# Patient Record
Sex: Female | Born: 1985 | Race: White | Hispanic: No | Smoking: Never smoker
Health system: Southern US, Community
[De-identification: ages and names within clinical notes are randomized; demographics above are authoritative.]

## PROBLEM LIST (undated history)

## (undated) ENCOUNTER — Inpatient Hospital Stay: Payer: Self-pay

## (undated) DIAGNOSIS — F419 Anxiety disorder, unspecified: Secondary | ICD-10-CM

## (undated) DIAGNOSIS — F329 Major depressive disorder, single episode, unspecified: Secondary | ICD-10-CM

## (undated) DIAGNOSIS — F32A Depression, unspecified: Secondary | ICD-10-CM

---
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
## 2005-09-12 ENCOUNTER — Emergency Department: Payer: Self-pay | Admitting: Unknown Physician Specialty

## 2005-09-13 ENCOUNTER — Ambulatory Visit: Payer: Self-pay | Admitting: Unknown Physician Specialty

## 2006-05-07 ENCOUNTER — Inpatient Hospital Stay: Payer: Self-pay | Admitting: Obstetrics & Gynecology

## 2007-09-13 ENCOUNTER — Ambulatory Visit: Payer: Self-pay | Admitting: Family Medicine

## 2010-05-28 ENCOUNTER — Ambulatory Visit: Payer: Self-pay | Admitting: Internal Medicine

## 2010-08-26 ENCOUNTER — Inpatient Hospital Stay: Payer: Self-pay | Admitting: Obstetrics and Gynecology

## 2014-10-14 HISTORY — PX: WISDOM TOOTH EXTRACTION: SHX21

## 2016-12-01 LAB — OB RESULTS CONSOLE GC/CHLAMYDIA: GC PROBE AMP, GENITAL: NEGATIVE

## 2017-01-05 DIAGNOSIS — H66002 Acute suppurative otitis media without spontaneous rupture of ear drum, left ear: Secondary | ICD-10-CM | POA: Diagnosis not present

## 2017-01-05 DIAGNOSIS — J069 Acute upper respiratory infection, unspecified: Secondary | ICD-10-CM | POA: Diagnosis not present

## 2017-01-25 DIAGNOSIS — J029 Acute pharyngitis, unspecified: Secondary | ICD-10-CM | POA: Diagnosis not present

## 2017-01-25 DIAGNOSIS — J020 Streptococcal pharyngitis: Secondary | ICD-10-CM | POA: Diagnosis not present

## 2017-01-25 DIAGNOSIS — H65192 Other acute nonsuppurative otitis media, left ear: Secondary | ICD-10-CM | POA: Diagnosis not present

## 2017-01-28 DIAGNOSIS — H5203 Hypermetropia, bilateral: Secondary | ICD-10-CM | POA: Diagnosis not present

## 2017-02-13 DIAGNOSIS — F988 Other specified behavioral and emotional disorders with onset usually occurring in childhood and adolescence: Secondary | ICD-10-CM | POA: Diagnosis not present

## 2017-02-13 DIAGNOSIS — F329 Major depressive disorder, single episode, unspecified: Secondary | ICD-10-CM | POA: Diagnosis not present

## 2017-05-27 DIAGNOSIS — F988 Other specified behavioral and emotional disorders with onset usually occurring in childhood and adolescence: Secondary | ICD-10-CM | POA: Diagnosis not present

## 2017-05-27 DIAGNOSIS — F329 Major depressive disorder, single episode, unspecified: Secondary | ICD-10-CM | POA: Diagnosis not present

## 2017-05-27 DIAGNOSIS — R238 Other skin changes: Secondary | ICD-10-CM | POA: Diagnosis not present

## 2017-05-31 LAB — OB RESULTS CONSOLE GC/CHLAMYDIA: CHLAMYDIA, DNA PROBE: NEGATIVE

## 2017-06-24 DIAGNOSIS — Z113 Encounter for screening for infections with a predominantly sexual mode of transmission: Secondary | ICD-10-CM | POA: Diagnosis not present

## 2017-06-24 DIAGNOSIS — Z124 Encounter for screening for malignant neoplasm of cervix: Secondary | ICD-10-CM | POA: Diagnosis not present

## 2017-06-24 DIAGNOSIS — R87613 High grade squamous intraepithelial lesion on cytologic smear of cervix (HGSIL): Secondary | ICD-10-CM | POA: Diagnosis not present

## 2017-06-24 DIAGNOSIS — Z1151 Encounter for screening for human papillomavirus (HPV): Secondary | ICD-10-CM | POA: Diagnosis not present

## 2017-06-24 DIAGNOSIS — Z01419 Encounter for gynecological examination (general) (routine) without abnormal findings: Secondary | ICD-10-CM | POA: Diagnosis not present

## 2017-07-08 DIAGNOSIS — R87613 High grade squamous intraepithelial lesion on cytologic smear of cervix (HGSIL): Secondary | ICD-10-CM | POA: Diagnosis not present

## 2017-07-08 DIAGNOSIS — R87810 Cervical high risk human papillomavirus (HPV) DNA test positive: Secondary | ICD-10-CM | POA: Diagnosis not present

## 2017-07-08 DIAGNOSIS — B977 Papillomavirus as the cause of diseases classified elsewhere: Secondary | ICD-10-CM | POA: Diagnosis not present

## 2017-07-08 DIAGNOSIS — N871 Moderate cervical dysplasia: Secondary | ICD-10-CM | POA: Diagnosis not present

## 2017-07-31 DIAGNOSIS — N871 Moderate cervical dysplasia: Secondary | ICD-10-CM | POA: Diagnosis not present

## 2017-07-31 DIAGNOSIS — Z3200 Encounter for pregnancy test, result unknown: Secondary | ICD-10-CM | POA: Diagnosis not present

## 2017-07-31 DIAGNOSIS — N72 Inflammatory disease of cervix uteri: Secondary | ICD-10-CM | POA: Diagnosis not present

## 2017-07-31 DIAGNOSIS — B977 Papillomavirus as the cause of diseases classified elsewhere: Secondary | ICD-10-CM | POA: Diagnosis not present

## 2017-09-24 DIAGNOSIS — O3680X1 Pregnancy with inconclusive fetal viability, fetus 1: Secondary | ICD-10-CM | POA: Diagnosis not present

## 2017-09-24 DIAGNOSIS — Z9889 Other specified postprocedural states: Secondary | ICD-10-CM | POA: Diagnosis not present

## 2017-09-24 DIAGNOSIS — Z3201 Encounter for pregnancy test, result positive: Secondary | ICD-10-CM | POA: Diagnosis not present

## 2017-09-24 DIAGNOSIS — O3440 Maternal care for other abnormalities of cervix, unspecified trimester: Secondary | ICD-10-CM | POA: Diagnosis not present

## 2017-09-24 DIAGNOSIS — Z3200 Encounter for pregnancy test, result unknown: Secondary | ICD-10-CM | POA: Diagnosis not present

## 2017-10-08 DIAGNOSIS — Z3201 Encounter for pregnancy test, result positive: Secondary | ICD-10-CM | POA: Diagnosis not present

## 2017-10-08 DIAGNOSIS — G43909 Migraine, unspecified, not intractable, without status migrainosus: Secondary | ICD-10-CM | POA: Diagnosis not present

## 2017-10-08 DIAGNOSIS — Z9889 Other specified postprocedural states: Secondary | ICD-10-CM | POA: Diagnosis not present

## 2017-10-14 LAB — OB RESULTS CONSOLE RUBELLA ANTIBODY, IGM: Rubella: NON-IMMUNE/NOT IMMUNE

## 2017-10-23 DIAGNOSIS — O3440 Maternal care for other abnormalities of cervix, unspecified trimester: Secondary | ICD-10-CM | POA: Diagnosis not present

## 2017-10-23 DIAGNOSIS — Z9889 Other specified postprocedural states: Secondary | ICD-10-CM | POA: Diagnosis not present

## 2017-10-28 DIAGNOSIS — O3440 Maternal care for other abnormalities of cervix, unspecified trimester: Secondary | ICD-10-CM | POA: Diagnosis not present

## 2017-10-28 DIAGNOSIS — Z3480 Encounter for supervision of other normal pregnancy, unspecified trimester: Secondary | ICD-10-CM | POA: Diagnosis not present

## 2017-10-28 DIAGNOSIS — Z9889 Other specified postprocedural states: Secondary | ICD-10-CM | POA: Diagnosis not present

## 2017-10-28 LAB — OB RESULTS CONSOLE RPR: RPR: NONREACTIVE

## 2017-10-28 LAB — OB RESULTS CONSOLE VARICELLA ZOSTER ANTIBODY, IGG: VARICELLA IGG: IMMUNE

## 2017-10-28 LAB — OB RESULTS CONSOLE HEPATITIS B SURFACE ANTIGEN: Hepatitis B Surface Ag: NEGATIVE

## 2017-10-28 LAB — OB RESULTS CONSOLE HIV ANTIBODY (ROUTINE TESTING): HIV: NONREACTIVE

## 2017-10-30 ENCOUNTER — Other Ambulatory Visit: Payer: Self-pay | Admitting: Obstetrics and Gynecology

## 2017-10-30 DIAGNOSIS — Z369 Encounter for antenatal screening, unspecified: Secondary | ICD-10-CM

## 2017-11-11 DIAGNOSIS — Z9889 Other specified postprocedural states: Secondary | ICD-10-CM | POA: Diagnosis not present

## 2017-11-11 DIAGNOSIS — O3441 Maternal care for other abnormalities of cervix, first trimester: Secondary | ICD-10-CM | POA: Diagnosis not present

## 2017-11-13 ENCOUNTER — Ambulatory Visit: Payer: Self-pay

## 2017-12-01 DIAGNOSIS — R51 Headache: Secondary | ICD-10-CM | POA: Diagnosis not present

## 2017-12-01 DIAGNOSIS — O26892 Other specified pregnancy related conditions, second trimester: Secondary | ICD-10-CM | POA: Diagnosis not present

## 2017-12-01 DIAGNOSIS — Z113 Encounter for screening for infections with a predominantly sexual mode of transmission: Secondary | ICD-10-CM | POA: Diagnosis not present

## 2017-12-01 DIAGNOSIS — N898 Other specified noninflammatory disorders of vagina: Secondary | ICD-10-CM | POA: Diagnosis not present

## 2018-02-25 DIAGNOSIS — Z3482 Encounter for supervision of other normal pregnancy, second trimester: Secondary | ICD-10-CM | POA: Diagnosis not present

## 2018-02-25 DIAGNOSIS — Z23 Encounter for immunization: Secondary | ICD-10-CM | POA: Diagnosis not present

## 2018-03-05 DIAGNOSIS — O26849 Uterine size-date discrepancy, unspecified trimester: Secondary | ICD-10-CM | POA: Diagnosis not present

## 2018-03-27 ENCOUNTER — Observation Stay
Admission: EM | Admit: 2018-03-27 | Discharge: 2018-03-27 | Disposition: A | Discharge status: Home / Self Care | Payer: 59 | Attending: Obstetrics and Gynecology | Admitting: Obstetrics and Gynecology

## 2018-03-27 ENCOUNTER — Other Ambulatory Visit: Payer: Self-pay

## 2018-03-27 DIAGNOSIS — Z3A32 32 weeks gestation of pregnancy: Secondary | ICD-10-CM | POA: Diagnosis not present

## 2018-03-27 DIAGNOSIS — O368130 Decreased fetal movements, third trimester, not applicable or unspecified: Secondary | ICD-10-CM | POA: Diagnosis not present

## 2018-03-27 NOTE — Progress Notes (Signed)
Spoke to OR nurse Uvaldo BristleAmanda Helton, RN, who gave report to Dalbert GarnetBeasley, MD. Dalbert GarnetBeasley, MD, gave order for pt to be d/c. Discussed with pt and pt reports more fetal movement and feels comfortable going home. Pt states the fetal movement "feels like it does at home". D/c instructions reviewed and educated. Pt verbalized understanding.

## 2018-03-27 NOTE — OB Triage Note (Signed)
Pt G3P2 presents to L&D for decreased FM. Pt states she has felt movement, just different from what she normally feels. No ctx, LOF or vaginal bleeding. VSS. Monitors applied and assessing.

## 2018-03-27 NOTE — Discharge Summary (Signed)
Triage Visit with NST    Vanna Scotlandeara L Raysor is a 32 y.o. [id number]. She is at 6774w0d gestation.  Indication: Decreased fetal movement  S: Resting comfortably. no CTX, no VB.  - Patient is now feeling baby  Move well.   :  BP 112/68 (BP Location: Right Arm)   Pulse 85   Temp 98.3 F (36.8 C) (Oral)   Resp 16   Ht 5\' 2"  (1.575 m)   Wt 93.4 kg (206 lb)   LMP 08/15/2017 (Exact Date)   BMI 37.68 kg/m  No results found for this or any previous visit (from the past 48 hour(s)).   Gen: NAD, AAOx3      Abd: FNTTP      Ext: Non-tender, Nonedmeatous    FHT: 135, moderate variability, +accels x2 15x15, no decels TOCO: quiet SVE: deferred   A/P:  32 y.o. G3P2002 2874w0d with concerns for decreased fetal movement, now resolved.   Labor: not present.   Fetal Wellbeing: NST is Reassuring reactive tracing   D/c home stable, precautions reviewed, follow-up as scheduled.

## 2018-04-13 DIAGNOSIS — O26843 Uterine size-date discrepancy, third trimester: Secondary | ICD-10-CM | POA: Diagnosis not present

## 2018-05-01 DIAGNOSIS — O09893 Supervision of other high risk pregnancies, third trimester: Secondary | ICD-10-CM | POA: Diagnosis not present

## 2018-05-01 NOTE — H&P (Signed)
Obstetric Preoperative History and Physical  Brandi Brown is a 32 y.o. [id number] with IUP at [redacted]w[redacted]d presenting for scheduled repeat cesarean section with BTL.  No acute concerns.   Prenatal Course Source of Care: [facility]  with onset of care at 1st trimester Pregnancy complications or risks: Patient Active Problem List   Diagnosis Date Noted  . Indication for care in labor or delivery 03/27/2018   She plans to breastfeed She desires bilateral tubal ligation for postpartum contraception.   Prenatal labs and studies: ABO, Rh: --/--/PENDING (08/05 4540) Antibody: PENDING (08/05 9811) Rubella: Nonimmune (01/01 0000) RPR: Nonreactive (01/15 0000)  HBsAg: Negative (01/15 0000)  HIV: Non-reactive (01/15 0000)  GBS:  neg 1 hr Glucola 111 Genetic screening normal Anatomy US normal   History reviewed. No pertinent past medical history.  Past Surgical History:  Procedure Laterality Date  . CESAREAN SECTION     2007, 2011  . WISDOM TOOTH EXTRACTION  2016    OB History  Gravida Para Term Preterm AB Living  3 2 2     2   SAB TAB Ectopic Multiple Live Births          2    # Outcome Date GA Lbr Len/2nd Weight Sex Delivery Anes PTL Lv  3 Current           2 Term 08/26/10     CS-LTranv   LIV  1 Term 05/07/06     CS-LTranv   LIV     Complications: Fetal Intolerance    Social History   Socioeconomic History  . Marital status: Married    Spouse name: Not on file  . Number of children: Not on file  . Years of education: Not on file  . Highest education level: Not on file  Occupational History  . Not on file  Social Needs  . Financial resource strain: Not on file  . Food insecurity:    Worry: Not on file    Inability: Not on file  . Transportation needs:    Medical: Not on file    Non-medical: Not on file  Tobacco Use  . Smoking status: Never Smoker  . Smokeless tobacco: Never Used  Substance and Sexual Activity  . Alcohol use: Not Currently    Frequency: Never  . Drug use:  Never  . Sexual activity: Yes  Lifestyle  . Physical activity:    Days per week: Not on file    Minutes per session: Not on file  . Stress: Not on file  Relationships  . Social connections:    Talks on phone: Not on file    Gets together: Not on file    Attends religious service: Not on file    Active member of club or organization: Not on file    Attends meetings of clubs or organizations: Not on file    Relationship status: Not on file  Other Topics Concern  . Not on file  Social History Narrative  . Not on file    History reviewed. No pertinent family history.  Medications Prior to Admission  Medication Sig Dispense Refill Last Dose  . Prenatal Vit-Fe Fumarate-FA (PRENATAL MULTIVITAMIN) TABS tablet Take 1 tablet by mouth daily.    03/27/2018 at Unknown time    Allergies  Allergen Reactions  . Bee Venom Swelling    Review of Systems: Negative except for what is mentioned in HPI.  Physical Exam: BP 119/82 (BP Location: Left Arm)   Pulse 81   Temp  97.9 F (36.6 C) (Oral)   Resp 16   Ht 5\' 2"  (1.575 m)   Wt 96.2 kg (212 lb)   LMP 08/15/2017 (Exact Date)   BMI 38.78 kg/m  FHR by Doppler: 145 bpm CONSTITUTIONAL: Well-developed, well-nourished female in no acute distress.  HENT:  Normocephalic, atraumatic, External right and left ear normal. Oropharynx is clear and moist EYES: Conjunctivae and EOM are normal. Pupils are equal, round, and reactive to light. No scleral icterus.  NECK: Normal range of motion, supple, no masses SKIN: Skin is warm and dry. No rash noted. Not diaphoretic. No erythema. No pallor. NEUROLGIC: Alert and oriented to person, place, and time. Normal reflexes, muscle tone coordination. No cranial nerve deficit noted. PSYCHIATRIC: Normal mood and affect. Normal behavior. Normal judgment and thought content. CARDIOVASCULAR: Normal heart rate noted, regular rhythm RESPIRATORY: Effort and breath sounds normal, no problems with respiration  noted ABDOMEN: Soft, nontender, nondistended, gravid. Well-healed Pfannenstiel incision. PELVIC: Deferred MUSCULOSKELETAL: Normal range of motion. No edema and no tenderness. 2+ distal pulses.   Pertinent Labs/Studies:   Results for orders placed or performed during the hospital encounter of 05/18/18 (from the past 72 hour(s))  ABO/Rh     Status: None   Collection Time: 05/18/18  6:30 AM  Result Value Ref Range   ABO/RH(D)      O POS Performed at [facility], [address] [place], [place] [address]   CBC with Differential/Platelet     Status: Abnormal   Collection Time: 05/18/18  6:41 AM  Result Value Ref Range   WBC 10.5 3.6 - 11.0 K/uL   RBC 3.62 (L) 3.80 - 5.20 MIL/uL   Hemoglobin 11.4 (L) 12.0 - 16.0 g/dL   HCT 69.6 (L) 29.5 - 28.4 %   MCV 90.0 80.0 - 100.0 fL   MCH 31.6 26.0 - 34.0 pg   MCHC 35.1 32.0 - 36.0 g/dL   RDW 13.2 (H) 44.0 - 10.2 %   Platelets 194 150 - 440 K/uL   Neutrophils Relative % 76 %   Neutro Abs 8.1 (H) 1.4 - 6.5 K/uL   Lymphocytes Relative 16 %   Lymphs Abs 1.6 1.0 - 3.6 K/uL   Monocytes Relative 7 %   Monocytes Absolute 0.7 0.2 - 0.9 K/uL   Eosinophils Relative 1 %   Eosinophils Absolute 0.1 0 - 0.7 K/uL   Basophils Relative 0 %   Basophils Absolute 0.0 0 - 0.1 K/uL    Comment: Performed at [facility], [address] [place], [place] [address]  Type and screen [facility]     Status: None (Preliminary result)   Collection Time: 05/18/18  6:41 AM  Result Value Ref Range   ABO/RH(D) PENDING    Antibody Screen PENDING    Sample Expiration      05/21/2018 Performed at [facility], [address], [place], [place] [address]     Assessment and Plan :Brandi Brown is a 32 y.o. G3P2002 at [redacted]w[redacted]d being admitted for scheduled cesarean section. The risks of cesarean section discussed with the patient included but were not limited to: bleeding which may require transfusion or reoperation;  infection which may require antibiotics; injury to bowel, bladder, ureters or other surrounding organs; injury to the fetus; need for additional procedures including hysterectomy in the event of a life-threatening hemorrhage; placental abnormalities wth subsequent pregnancies, incisional problems, thromboembolic phenomenon and other postoperative/anesthesia complications. The patient concurred with the proposed plan, giving informed written consent  for the procedure. Patient has been NPO since last night she will remain NPO for procedure. Anesthesia and OR aware. Preoperative prophylactic antibiotics and SCDs ordered on call to the OR. To OR when ready.    Christeen DouglasBethany Noha Karasik, MD, MPH, Evern CoreFACOG

## 2018-05-08 DIAGNOSIS — O09893 Supervision of other high risk pregnancies, third trimester: Secondary | ICD-10-CM | POA: Diagnosis not present

## 2018-05-15 ENCOUNTER — Other Ambulatory Visit: Payer: Self-pay

## 2018-05-15 ENCOUNTER — Encounter
Admission: RE | Admit: 2018-05-15 | Discharge: 2018-05-15 | Disposition: A | Discharge status: Home / Self Care | Payer: Commercial Managed Care - PPO | Source: Ambulatory Visit | Attending: Obstetrics and Gynecology | Admitting: Obstetrics and Gynecology

## 2018-05-15 DIAGNOSIS — Z01812 Encounter for preprocedural laboratory examination: Secondary | ICD-10-CM | POA: Insufficient documentation

## 2018-05-15 LAB — BASIC METABOLIC PANEL
Anion gap: 8 (ref 5–15)
BUN: 6 mg/dL (ref 6–20)
CHLORIDE: 107 mmol/L (ref 98–111)
CO2: 23 mmol/L (ref 22–32)
Calcium: 8.9 mg/dL (ref 8.9–10.3)
Creatinine, Ser: 0.62 mg/dL (ref 0.44–1.00)
GFR calc non Af Amer: >60 mL/min (ref >60)
Glucose, Bld: 111 mg/dL — ABNORMAL HIGH (ref 70–99)
Potassium: 3 mmol/L — ABNORMAL LOW (ref 3.5–5.1)
Sodium: 138 mmol/L (ref 135–145)

## 2018-05-15 NOTE — Patient Instructions (Signed)
  Your procedure is scheduled on: Monday May 18, 2018 Report to the Emergency Department on Monday May 18, 2018 @ 5:30 am.  Remember: Instructions that are not followed completely may result in serious medical risk, up to and including death, or upon the discretion of your surgeon and anesthesiologist your surgery may need to be rescheduled.    _x___ 1. Do not eat food (including mints, candies, chewing gum) after midnight the night before your procedure. You may drink clear liquids up to 2 hours before you are scheduled to arrive at the hospital for your procedure.  Do not drink clear liquids within 2 hours of your scheduled arrival to the hospital.  Clear liquids include  --Water or Apple juice without pulp  --Clear carbohydrate beverage such as Gatorade  --Black Coffee or Clear Tea (No milk, no creamers, do not add anything to the coffee or tea)    __x__ 2. No Alcohol for 24 hours before or after surgery.   __x__ 3. No Smoking or e-cigarettes for 24 prior to surgery.  Do not use any chewable tobacco products for at least 6 hour prior to surgery   __x__ 4. Notify your doctor if there is any change in your medical condition (cold, fever, infections).   __x__ 5. On the morning of surgery brush your teeth with toothpaste and water.  You may rinse your mouth with mouth wash if you wish.  Do not swallow any toothpaste or mouthwash.  Please read over the following fact sheets that you were given:   [facility] Pre[facility] and or MRSA Information   Incentive Spirometry Instructions   __x__ Use CHG Soap or sage wipes as directed on instruction sheet    Do not wear jewelry, make-up, hairpins, clips or nail polish.  Do not wear lotions, powders, deodorant, or perfumes.   Do not shave below the face/neck 48 hours prior to surgery.   Do not bring valuables to the hospital.    [facility] is not responsible for any belongings or valuables.               Contacts/glasses may not be  worn into surgery.  Leave your suitcase in the car. After surgery it may be brought to your room.  For patients admitted to the hospital, discharge time is determined by your treatment team.   _x___ Take anti-hypertensive listed below, cardiac, seizure, asthma, anti-reflux and psychiatric medicines. These include:  1. None  _x___ Follow recommendations from Cardiologist, Pulmonologist or PCP regarding stopping Aspirin, Coumadin, Plavix ,Eliquis, Effient, or Pradaxa, and Pletal.  _x___ Stop Anti-inflammatories such as Advil, Aleve, Ibuprofen, Motrin, Naproxen, Naprosyn, Goodies powders or aspirin products. OK to take Tylenol and Celebrex.   _x___ NOW: Stop supplements until after surgery.  But may continue Vitamin D, Vitamin B, and multivitamin.

## 2018-05-17 MED ORDER — CEFAZOLIN SODIUM-DEXTROSE 2-4 GM/100ML-% IV SOLN
2.0000 g | INTRAVENOUS | Status: AC
  Administered 2018-05-18: 2 g via INTRAVENOUS
  Filled 2018-05-17 (×2): qty 100

## 2018-05-18 ENCOUNTER — Inpatient Hospital Stay: Payer: Medicaid Other | Admitting: Anesthesiology

## 2018-05-18 ENCOUNTER — Encounter: Payer: Self-pay | Admitting: Obstetrics and Gynecology

## 2018-05-18 ENCOUNTER — Other Ambulatory Visit: Payer: Self-pay

## 2018-05-18 ENCOUNTER — Inpatient Hospital Stay
Admission: RE | Admit: 2018-05-18 | Discharge: 2018-05-20 | DRG: 784 | Disposition: A | Discharge status: Home / Self Care | Payer: Medicaid Other | Attending: Obstetrics and Gynecology | Admitting: Obstetrics and Gynecology

## 2018-05-18 ENCOUNTER — Encounter
Admission: RE | Disposition: A | Discharge status: Home / Self Care | Payer: Self-pay | Source: Home / Self Care | Attending: Obstetrics and Gynecology

## 2018-05-18 DIAGNOSIS — Z3A39 39 weeks gestation of pregnancy: Secondary | ICD-10-CM

## 2018-05-18 DIAGNOSIS — O99344 Other mental disorders complicating childbirth: Secondary | ICD-10-CM | POA: Diagnosis present

## 2018-05-18 DIAGNOSIS — Z302 Encounter for sterilization: Secondary | ICD-10-CM

## 2018-05-18 DIAGNOSIS — Z23 Encounter for immunization: Secondary | ICD-10-CM | POA: Diagnosis not present

## 2018-05-18 DIAGNOSIS — Z98891 History of uterine scar from previous surgery: Secondary | ICD-10-CM

## 2018-05-18 DIAGNOSIS — O9081 Anemia of the puerperium: Secondary | ICD-10-CM | POA: Diagnosis not present

## 2018-05-18 DIAGNOSIS — O99214 Obesity complicating childbirth: Secondary | ICD-10-CM | POA: Diagnosis present

## 2018-05-18 DIAGNOSIS — O34211 Maternal care for low transverse scar from previous cesarean delivery: Secondary | ICD-10-CM | POA: Diagnosis not present

## 2018-05-18 DIAGNOSIS — D62 Acute posthemorrhagic anemia: Secondary | ICD-10-CM | POA: Diagnosis not present

## 2018-05-18 DIAGNOSIS — F329 Major depressive disorder, single episode, unspecified: Secondary | ICD-10-CM | POA: Diagnosis present

## 2018-05-18 DIAGNOSIS — D500 Iron deficiency anemia secondary to blood loss (chronic): Secondary | ICD-10-CM | POA: Diagnosis not present

## 2018-05-18 LAB — CBC WITH DIFFERENTIAL/PLATELET
BASOS ABS: 0 10*3/uL (ref 0–0.1)
Basophils Relative: 0 %
Eosinophils Absolute: 0.1 10*3/uL (ref 0–0.7)
Eosinophils Relative: 1 %
HCT: 32.5 % — ABNORMAL LOW (ref 35.0–47.0)
HEMOGLOBIN: 11.4 g/dL — AB (ref 12.0–16.0)
Lymphocytes Relative: 16 %
Lymphs Abs: 1.6 10*3/uL (ref 1.0–3.6)
MCH: 31.6 pg (ref 26.0–34.0)
MCHC: 35.1 g/dL (ref 32.0–36.0)
MCV: 90 fL (ref 80.0–100.0)
Monocytes Absolute: 0.7 10*3/uL (ref 0.2–0.9)
Monocytes Relative: 7 %
NEUTROS ABS: 8.1 10*3/uL — AB (ref 1.4–6.5)
NEUTROS PCT: 76 %
PLATELETS: 194 10*3/uL (ref 150–440)
RBC: 3.62 MIL/uL — ABNORMAL LOW (ref 3.80–5.20)
RDW: 14.7 % — ABNORMAL HIGH (ref 11.5–14.5)
WBC: 10.5 10*3/uL (ref 3.6–11.0)

## 2018-05-18 LAB — TYPE AND SCREEN
ABO/RH(D): O POS
ANTIBODY SCREEN: NEGATIVE

## 2018-05-18 LAB — ABO/RH: ABO/RH(D): O POS

## 2018-05-18 SURGERY — Surgical Case
Anesthesia: Spinal | Site: Abdomen | Laterality: Bilateral | Wound class: Clean Contaminated

## 2018-05-18 MED ORDER — BISACODYL 10 MG RE SUPP: 10.0000 mg | Freq: Every day | RECTAL | Status: DC | PRN

## 2018-05-18 MED ORDER — OXYTOCIN 40 UNITS IN LACTATED RINGERS INFUSION - SIMPLE MED
INTRAVENOUS | Status: AC
  Administered 2018-05-18: 62.5 mL/h
  Filled 2018-05-18: qty 1000

## 2018-05-18 MED ORDER — LACTATED RINGERS IV SOLN
INTRAVENOUS | Status: DC
  Administered 2018-05-19: 02:00:00 via INTRAVENOUS

## 2018-05-18 MED ORDER — ACETAMINOPHEN 325 MG PO TABS
650.0000 mg | ORAL_TABLET | ORAL | Status: DC | PRN
  Administered 2018-05-18 (×2): 650 mg via ORAL
  Filled 2018-05-18 (×2): qty 2

## 2018-05-18 MED ORDER — SIMETHICONE 80 MG PO CHEW
80.0000 mg | CHEWABLE_TABLET | Freq: Three times a day (TID) | ORAL | Status: DC
  Administered 2018-05-18 – 2018-05-20 (×7): 80 mg via ORAL
  Filled 2018-05-18 (×7): qty 1

## 2018-05-18 MED ORDER — OXYTOCIN 40 UNITS IN LACTATED RINGERS INFUSION - SIMPLE MED
INTRAVENOUS | Status: AC
  Filled 2018-05-18: qty 1000

## 2018-05-18 MED ORDER — ONDANSETRON HCL 4 MG/2ML IJ SOLN
INTRAMUSCULAR | Status: AC
  Filled 2018-05-18: qty 2

## 2018-05-18 MED ORDER — LACTATED RINGERS IV SOLN
INTRAVENOUS | Status: DC
  Administered 2018-05-18: 07:00:00 via INTRAVENOUS

## 2018-05-18 MED ORDER — DIPHENHYDRAMINE HCL 25 MG PO CAPS
25.0000 mg | ORAL_CAPSULE | Freq: Four times a day (QID) | ORAL | Status: DC | PRN
  Administered 2018-05-18 (×2): 25 mg via ORAL
  Filled 2018-05-18 (×2): qty 1

## 2018-05-18 MED ORDER — MORPHINE SULFATE (PF) 0.5 MG/ML IJ SOLN
INTRAMUSCULAR | Status: DC | PRN
  Administered 2018-05-18: .1 mg via EPIDURAL

## 2018-05-18 MED ORDER — BUPIVACAINE HCL (PF) 0.5 % IJ SOLN
INTRAMUSCULAR | Status: AC
  Filled 2018-05-18: qty 30

## 2018-05-18 MED ORDER — ACETAMINOPHEN 10 MG/ML IV SOLN
INTRAVENOUS | Status: AC
  Filled 2018-05-18: qty 100

## 2018-05-18 MED ORDER — DEXAMETHASONE SODIUM PHOSPHATE 10 MG/ML IJ SOLN
INTRAMUSCULAR | Status: DC | PRN
  Administered 2018-05-18: 10 mg via INTRAVENOUS

## 2018-05-18 MED ORDER — ONDANSETRON HCL 4 MG/2ML IJ SOLN: 4.0000 mg | Freq: Once | INTRAMUSCULAR | Status: DC | PRN

## 2018-05-18 MED ORDER — PRENATAL MULTIVITAMIN CH
1.0000 | ORAL_TABLET | Freq: Every day | ORAL | Status: DC
  Administered 2018-05-18 – 2018-05-20 (×3): 1 via ORAL
  Filled 2018-05-18 (×3): qty 1

## 2018-05-18 MED ORDER — EPINEPHRINE PF 1 MG/ML IJ SOLN
INTRAMUSCULAR | Status: AC
  Filled 2018-05-18: qty 1

## 2018-05-18 MED ORDER — FLEET ENEMA 7-19 GM/118ML RE ENEM: 1.0000 | ENEMA | Freq: Every day | RECTAL | Status: DC | PRN

## 2018-05-18 MED ORDER — SOD CITRATE-CITRIC ACID 500-334 MG/5ML PO SOLN
30.0000 mL | ORAL | Status: AC
  Administered 2018-05-18: 30 mL via ORAL
  Filled 2018-05-18: qty 15

## 2018-05-18 MED ORDER — OXYTOCIN 40 UNITS IN LACTATED RINGERS INFUSION - SIMPLE MED: 2.5000 [IU]/h | INTRAVENOUS | Status: AC

## 2018-05-18 MED ORDER — DIBUCAINE 1 % RE OINT: 1.0000 "application " | TOPICAL_OINTMENT | RECTAL | Status: DC | PRN

## 2018-05-18 MED ORDER — WITCH HAZEL-GLYCERIN EX PADS: 1.0000 "application " | MEDICATED_PAD | CUTANEOUS | Status: DC | PRN

## 2018-05-18 MED ORDER — LACTATED RINGERS IV BOLUS
1000.0000 mL | Freq: Once | INTRAVENOUS | Status: AC
  Administered 2018-05-18: 1000 mL via INTRAVENOUS

## 2018-05-18 MED ORDER — SIMETHICONE 80 MG PO CHEW: 80.0000 mg | CHEWABLE_TABLET | ORAL | Status: DC | PRN

## 2018-05-18 MED ORDER — LIDOCAINE HCL (PF) 1 % IJ SOLN
INTRAMUSCULAR | Status: DC | PRN
  Administered 2018-05-18: 3 mL via SUBCUTANEOUS

## 2018-05-18 MED ORDER — MORPHINE SULFATE (PF) 2 MG/ML IV SOLN: 1.0000 mg | INTRAVENOUS | Status: AC | PRN

## 2018-05-18 MED ORDER — BUPIVACAINE LIPOSOME 1.3 % IJ SUSP
20.0000 mL | Freq: Once | INTRAMUSCULAR | Status: DC
  Filled 2018-05-18: qty 20

## 2018-05-18 MED ORDER — MORPHINE SULFATE (PF) 0.5 MG/ML IJ SOLN
INTRAMUSCULAR | Status: AC
  Filled 2018-05-18: qty 10

## 2018-05-18 MED ORDER — OXYCODONE-ACETAMINOPHEN 5-325 MG PO TABS
2.0000 | ORAL_TABLET | ORAL | Status: DC | PRN
  Administered 2018-05-19: 2 via ORAL
  Filled 2018-05-18: qty 2

## 2018-05-18 MED ORDER — KETOROLAC TROMETHAMINE 30 MG/ML IJ SOLN
INTRAMUSCULAR | Status: DC | PRN
  Administered 2018-05-18: 30 mg via INTRAVENOUS

## 2018-05-18 MED ORDER — FENTANYL CITRATE (PF) 100 MCG/2ML IJ SOLN: 25.0000 ug | INTRAMUSCULAR | Status: DC | PRN

## 2018-05-18 MED ORDER — DEXAMETHASONE SODIUM PHOSPHATE 10 MG/ML IJ SOLN
INTRAMUSCULAR | Status: AC
  Filled 2018-05-18: qty 1

## 2018-05-18 MED ORDER — IBUPROFEN 600 MG PO TABS
600.0000 mg | ORAL_TABLET | Freq: Four times a day (QID) | ORAL | Status: DC
  Administered 2018-05-18 – 2018-05-20 (×9): 600 mg via ORAL
  Filled 2018-05-18 (×9): qty 1

## 2018-05-18 MED ORDER — ONDANSETRON HCL 4 MG/2ML IJ SOLN
INTRAMUSCULAR | Status: DC | PRN
  Administered 2018-05-18: 4 mg via INTRAVENOUS

## 2018-05-18 MED ORDER — OXYTOCIN 40 UNITS IN LACTATED RINGERS INFUSION - SIMPLE MED
INTRAVENOUS | Status: DC | PRN
  Administered 2018-05-18: 1000 mL via INTRAVENOUS

## 2018-05-18 MED ORDER — BUPIVACAINE IN DEXTROSE 0.75-8.25 % IT SOLN
INTRATHECAL | Status: DC | PRN
  Administered 2018-05-18: 1.5 mL via INTRATHECAL

## 2018-05-18 MED ORDER — SENNOSIDES-DOCUSATE SODIUM 8.6-50 MG PO TABS
2.0000 | ORAL_TABLET | ORAL | Status: DC
  Administered 2018-05-19 – 2018-05-20 (×2): 2 via ORAL
  Filled 2018-05-18 (×2): qty 2

## 2018-05-18 MED ORDER — BUPIVACAINE HCL (PF) 0.25 % IJ SOLN
INTRAMUSCULAR | Status: DC | PRN
  Administered 2018-05-18: 30 mL

## 2018-05-18 MED ORDER — SIMETHICONE 80 MG PO CHEW: 80.0000 mg | CHEWABLE_TABLET | ORAL | Status: DC

## 2018-05-18 MED ORDER — MENTHOL 3 MG MT LOZG
1.0000 | LOZENGE | OROMUCOSAL | Status: DC | PRN
  Filled 2018-05-18: qty 9

## 2018-05-18 MED ORDER — ACETAMINOPHEN 10 MG/ML IV SOLN
INTRAVENOUS | Status: DC | PRN
  Administered 2018-05-18: 1000 mg via INTRAVENOUS

## 2018-05-18 MED ORDER — SODIUM CHLORIDE 0.9 % IV SOLN
INTRAVENOUS | Status: DC | PRN
  Administered 2018-05-18: 25 ug/min via INTRAVENOUS

## 2018-05-18 MED ORDER — MEASLES, MUMPS & RUBELLA VAC ~~LOC~~ INJ
0.5000 mL | INJECTION | Freq: Once | SUBCUTANEOUS | Status: DC
  Filled 2018-05-18: qty 0.5

## 2018-05-18 MED ORDER — SODIUM CHLORIDE 0.9 % IJ SOLN
INTRAMUSCULAR | Status: AC
  Filled 2018-05-18: qty 50

## 2018-05-18 MED ORDER — TETANUS-DIPHTH-ACELL PERTUSSIS 5-2.5-18.5 LF-MCG/0.5 IM SUSP: 0.5000 mL | Freq: Once | INTRAMUSCULAR | Status: DC

## 2018-05-18 MED ORDER — PHENYLEPHRINE HCL 10 MG/ML IJ SOLN
INTRAMUSCULAR | Status: DC | PRN
  Administered 2018-05-18: 100 ug via INTRAVENOUS

## 2018-05-18 MED ORDER — SODIUM CHLORIDE 0.9 % IV SOLN
INTRAVENOUS | Status: DC | PRN
  Administered 2018-05-18: 70 mL

## 2018-05-18 MED ORDER — TETRACAINE HCL 1 % IJ SOLN
INTRAMUSCULAR | Status: DC | PRN
  Administered 2018-05-18: 2 mg via INTRASPINAL

## 2018-05-18 MED ORDER — OXYCODONE-ACETAMINOPHEN 5-325 MG PO TABS
1.0000 | ORAL_TABLET | ORAL | Status: DC | PRN
  Administered 2018-05-19 (×2): 1 via ORAL
  Filled 2018-05-18 (×2): qty 1

## 2018-05-18 MED ORDER — COCONUT OIL OIL
1.0000 "application " | TOPICAL_OIL | Status: DC | PRN
  Administered 2018-05-19: 1 via TOPICAL
  Filled 2018-05-18: qty 120

## 2018-05-18 MED ORDER — PHENYLEPHRINE HCL 10 MG/ML IJ SOLN
INTRAMUSCULAR | Status: AC
  Filled 2018-05-18: qty 1

## 2018-05-18 SURGICAL SUPPLY — 21 items
BARRIER ADHS 3X4 INTERCEED (GAUZE/BANDAGES/DRESSINGS) ×4 IMPLANT
CANISTER SUCT 3000ML PPV (MISCELLANEOUS) ×2 IMPLANT
CHLORAPREP W/TINT 26ML (MISCELLANEOUS) ×2 IMPLANT
DRSG TELFA 3X8 NADH (GAUZE/BANDAGES/DRESSINGS) ×2 IMPLANT
ELECT REM PT RETURN 9FT ADLT (ELECTROSURGICAL) ×2
ELECTRODE REM PT RTRN 9FT ADLT (ELECTROSURGICAL) ×1 IMPLANT
GAUZE SPONGE 4X4 12PLY STRL (GAUZE/BANDAGES/DRESSINGS) ×2 IMPLANT
GOWN STRL REUS W/ TWL LRG LVL3 (GOWN DISPOSABLE) ×3 IMPLANT
GOWN STRL REUS W/TWL LRG LVL3 (GOWN DISPOSABLE) ×3
NS IRRIG 1000ML POUR BTL (IV SOLUTION) ×2 IMPLANT
PAD OB MATERNITY 4.3X12.25 (PERSONAL CARE ITEMS) ×2 IMPLANT
PAD PREP 24X41 OB/GYN DISP (PERSONAL CARE ITEMS) ×2 IMPLANT
STAPLER INSORB 30 2030 C-SECTI (MISCELLANEOUS) ×2 IMPLANT
SUT MNCRL 4-0 (SUTURE) ×1
SUT MNCRL 4-0 27XMFL (SUTURE) ×1
SUT VIC AB 0 CT1 36 (SUTURE) ×6 IMPLANT
SUT VIC AB 0 CTX 36 (SUTURE) ×2
SUT VIC AB 0 CTX36XBRD ANBCTRL (SUTURE) ×2 IMPLANT
SUT VIC AB 2-0 SH 27 (SUTURE) ×4
SUT VIC AB 2-0 SH 27XBRD (SUTURE) ×4 IMPLANT
SUTURE MNCRL 4-0 27XMF (SUTURE) ×1 IMPLANT

## 2018-05-18 NOTE — Transfer of Care (Signed)
Immediate Anesthesia Transfer of Care Note  Patient: Brandi Brown  Procedure(s) Performed: REPEAT CESAREAN SECTION WITH BILATERAL TUBAL LIGATION (Bilateral Abdomen)  Patient Location: PACU  Anesthesia Type:Spinal  Level of Consciousness: awake, alert , oriented and patient cooperative  Airway & Oxygen Therapy: Patient Spontanous Breathing  Post-op Assessment: Report given to RN, Post -op Vital signs reviewed and stable and Patient moving all extremities  Post vital signs: Reviewed and stable  Last Vitals:  Vitals Value Taken Time  BP 95/64 05/18/2018  9:35 AM  Temp 36.6 C 05/18/2018  9:35 AM  Pulse 74 05/18/2018  9:35 AM  Resp 17 05/18/2018  9:35 AM  SpO2 100 % 05/18/2018  9:35 AM    Last Pain:  Vitals:   05/18/18 0935  TempSrc: Oral  PainSc: 0-No pain         Complications: No apparent anesthesia complications

## 2018-05-18 NOTE — Anesthesia Preprocedure Evaluation (Signed)
Anesthesia Evaluation  Patient identified by MRN, date of birth, ID band Patient awake    Reviewed: Allergy & Precautions, NPO status , Patient's Chart, lab work & pertinent test results  Airway Mallampati: II  TM Distance: >3 FB     Dental  (+) Teeth Intact   Pulmonary neg pulmonary ROS,    Pulmonary exam normal        Cardiovascular negative cardio ROS Normal cardiovascular exam     Neuro/Psych negative neurological ROS  negative psych ROS   GI/Hepatic negative GI ROS, Neg liver ROS,   Endo/Other  Morbid obesity  Renal/GU negative Renal ROS  negative genitourinary   Musculoskeletal negative musculoskeletal ROS (+)   Abdominal Normal abdominal exam  (+)   Peds negative pediatric ROS (+)  Hematology negative hematology ROS (+)   Anesthesia Other Findings   Reproductive/Obstetrics (+) Pregnancy                             Anesthesia Physical Anesthesia Plan  ASA: II  Anesthesia Plan: Spinal   Post-op Pain Management:    Induction: Intravenous  PONV Risk Score and Plan:   Airway Management Planned: Nasal Cannula  Additional Equipment:   Intra-op Plan:   Post-operative Plan:   Informed Consent: I have reviewed the patients History and Physical, chart, labs and discussed the procedure including the risks, benefits and alternatives for the proposed anesthesia with the patient or authorized representative who has indicated his/her understanding and acceptance.   Dental advisory given  Plan Discussed with: CRNA and Surgeon  Anesthesia Plan Comments:         Anesthesia Quick Evaluation

## 2018-05-18 NOTE — Discharge Summary (Signed)
Obstetrical Discharge Summary  Patient Name: Brandi Brown DOB: 09/12/1986 MRN: [id number]  Date of Admission: 05/18/2018 Date of Discharge: 05/20/2018  [facility]   Gestational Age at Delivery: 1479w3d   Antepartum complications:   1. Rubella Non immune  Vaccinate Post partum 2. History of LTCS x2  plans repeat 3. Depression  Takes Wellbutrin and Effexor  Admitting Diagnosis: scheduled repeat C/S  Patient Active Problem List   Diagnosis Date Noted  . Pregnancy with 39 completed weeks gestation 05/18/2018  . Indication for care in labor or delivery 03/27/2018   Intrapartum complications/course: Uncomplicated repeat cesarean section  Date of Delivery: 05/18/18 Delivered By: Christeen DouglasBethany Beasley Delivery Type: primary cesarean section, low transverse incision Anesthesia: spinal Placenta: Extracted Laceration:  Episiotomy: none Newborn Data: Live born female "Carleigh" Birth Weight: 7 lb 4.8 oz (3310 g) APGAR: 8, 9  Newborn Delivery   Birth date/time:  05/18/2018 08:25:00 Delivery type:  C-Section, Low Transverse Trial of labor:  No C-section categorization:  Repeat      Discharge Physical Exam:  BP 99/64 (BP Location: Left Arm)   Pulse 75   Temp 98.5 F (36.9 C) (Oral)   Resp 18   Ht 5\' 2"  (1.575 m)   Wt 96.2 kg (212 lb)   LMP 08/15/2017 (Exact Date)   SpO2 99%   Breastfeeding? Unknown   BMI 38.78 kg/m   General: NAD CV: RRR Pulm: CTABL, nl effort ABD: s/nd/nt, fundus firm and below the umbilicus Lochia: moderate Incision: c/d/i  DVT Evaluation: LE non-ttp, no evidence of DVT on exam.  Hemoglobin  Date Value Ref Range Status  05/20/2018 9.1 (L) 12.0 - 16.0 g/dL Final   HCT  Date Value Ref Range Status  05/20/2018 26.2 (L) 35.0 - 47.0 % Final    Post partum course: routine, started Zoloft 25mg  with plan to taper up to 50mg  Postpartum Procedures: none Disposition: stable, discharge to home. Baby Feeding: breastmilk  Baby  Disposition: home with mom  Rh Immune globulin given: n/a Rubella vaccine given: yes Tdap vaccine given in AP or PP setting: AP 02/25/18 Flu vaccine given in AP or PP setting: n/a  Contraception: BTL  Prenatal Labs: Blood type/Rh --/--/O POS (08/05 [id number])  Antibody screen neg  Rubella Non-immune  Varicella Immune  RPR NR  HBsAg Neg  HIV NR  GC neg  Chlamydia neg  Genetic screening declined  1 hour GTT 111  3 hour GTT n/a  GBS negative     Plan:  Brandi Brown was discharged to home in good condition. [facility]  with delivering provider in 2 weeks   Discharge Medications: Allergies as of 05/20/2018      Reactions   Bee Venom Swelling      Medication List    TAKE these medications   ferrous sulfate 325 (65 FE) MG tablet Take 1 tablet (325 mg total) by mouth daily with breakfast.   ibuprofen 600 MG tablet Commonly known as:  ADVIL,MOTRIN Take 1 tablet (600 mg total) by mouth every 6 (six) hours.   oxyCODONE 5 MG immediate release tablet Commonly known as:  ROXICODONE Take 1 tablet (5 mg total) by mouth every 8 (eight) hours as needed.   prenatal multivitamin Tabs tablet Take 1 tablet by mouth daily.   sertraline 50 MG tablet Commonly known as:  ZOLOFT Take 1 tablet (50 mg total) by mouth daily.            Discharge Care  Instructions  (From admission, onward)        Start     Ordered   05/20/18 0000  Discharge wound care:    Comments:  Keep incision dry, clean.   05/20/18 1345      Follow-up Information    Christeen Douglas, MD In 2 weeks.   Specialty:  Obstetrics and Gynecology Why:  For postop check Contact information: [address] [place] [address] [phone]           Signed: ----- Ranae Plumber, MD Attending Obstetrician and Gynecologist [facility], Department of OB/GYN [facility]

## 2018-05-18 NOTE — Op Note (Addendum)
Cesarean Section Procedure Note  Indications: 2 prior cesarean sections  Pre-operative Diagnosis:  1. Intrauterine pregnancy at [redacted]w[redacted]d;  2. Desires permanent sterilization  Post-operative Diagnosis: same, delivered.  Procedure: 1. Low Transverse Cesarean Section through Pfannenstiel incision  2. Bilateral tubal sterilization using modified [place] method, being sure to include removal of the fimbriated ends  Surgeon: Christeen Douglas  Assistant(s):  Heloise Ochoa, CNM  Anesthesia: Spinal anesthesia  Estimated Blood Loss:  300 mL         Drains: none         Total IV Fluids:  Urine Output:         Specimens: Portion of right and portion of left tubes         Complications:  None; patient tolerated the procedure well.         Disposition: PACU - hemodynamically stable.         Condition: stable  Findings:  A female infant "Brandi Brown" in cephalic presentation, adorable and vigorous on delivery. Amniotic fluid - Clear  Birth weight 3310 g.  Apgars of 8 and 9.   Intact placenta with a three-vessel cord.  Grossly normal uterus, tubes and ovaries bilaterally. Moderate intraabdominal adhesions were noted.  Procedure Details  The patient was taken to Operating Room, identified as the correct patient and the procedure verified as C-Section Delivery. A Time Out was held and the above information confirmed.  After induction of anesthesia, the patient was draped and prepped in the usual sterile manner. A Pfannenstiel incision was made and carried down through the subcutaneous tissue to the fascia. Fascial incision was made and extended transversely with the Mayo scissors. The fascia was separated from the underlying rectus tissue superiorly and inferiorly. The peritoneum was identified and entered bluntly. Peritoneal incision was extended longitudinally. The utero-vesical peritoneal reflection was incised transversely and a bladder flap was created digitally.   A low  transverse hysterotomy was made. The fetus was delivered atraumatically. The umbilical cord was clamped x2 and cut and the infant was handed to the awaiting pediatricians. The placenta was removed intact and appeared normal with a 3-vessel cord.   The uterus was exteriorized and cleared of all clot and debris. The hysterotomy was closed with running sutures of  0 Vicryl. A second imbricating layer was placed with the same suture. Excellent hemostasis was observed.   Attention was then turned to the tubal ligation. The left fallopian tube distinguished from the round ligament by identifying the fimbria and was grasped with a Babcock clamp in the midisthmic portion approximately 3 cm from the cornual region. It was then doubly ligated with 0-plain gut suture in a Pomeroy fashion. The tubal segment was excised with Metzenbaum scissors. Tubal ostea noted. The procedure was repeated on the right side, which was more difficult because the tube and fimbriae were attenuated and attached to the ovary and mesosalpinx. Several small figure of 8 sutures were placed in the adnexa to control bleeding after removal of the tube. *Care was noted to examine both tubal sites in situ to ensure the sutures were intact and no bleeding was noted. The uterus was returned to the abdomen.  The pelvis was irrigated and again, excellent hemostasis was noted. The fascia was then reapproximated with running sutures of 0 Vicryl. The subcutaneous tissue was reapproximated with running sutures of 0 vicryl. The skin was reapproximated with Insorb subcu staples.  Instrument, sponge, and needle counts were correct prior to the abdominal closure and at the  conclusion of the case.   The patient tolerated the procedure well and was transferred to the recovery room in stable condition.   Christeen DouglasBethany Jeany Seville, MD8/02/2018

## 2018-05-18 NOTE — Lactation Note (Signed)
This note was copied from a baby's chart. Lactation Consultation Note  Patient Name: Girl Stephens Shireeara Allende ZOXWR'UToday's Date: 05/18/2018  Assisted mom with first breast feed after returning to room from C/S.  [name] was rooting and opening mouth wide.  She latched with minimal assistance to left breast in biological cross cradle hold with wide flanged lips and began strong rhythmic sucking with occasional swallows.  Mom reports her 226, 588, and 33 year old sons never latched well causing painful bleeding nipples which stressed her out and she gave up.  She tried pumping without success.  She was so happy that [name] latched so well and sucked vigorously for 24 minutes.  Once she came off left breast, she immediately began rooting again.  Gently moved her to right breast.  She pulled the breast tissue in right away and began strong sucking, but was on the wrong area initially.  Immediately broke suction and put her on correctly and she began good rhythmic sucking for long interval on right breast as well.  Reviewed supply and demand, routine newborn feeding patterns and normal course of lactation.  When mom transferrred to room on M/B unit, lactation name and number written on white board and encouraged to call with any questions, concerns or assistance.   Maternal Data    Feeding Feeding Type: Breast Fed Length of feed: 15 min  LATCH Score                   Interventions    Lactation Tools Discussed/Used     Consult Status      Louis MeckelWilliams, Beau Vanduzer Kay 05/18/2018, 5:29 PM

## 2018-05-18 NOTE — ED Triage Notes (Signed)
Pt arrived ambulatory with steady gait for scheduled c-section-due date 05/22/18; [facility]; G3P2; pt denies complications, denies abd pain, denies leaking fluid; positive fetal movement; taken to Labor and Delivery room 6 via wheelchair by ED tech Maralyn SagoSarah;

## 2018-05-18 NOTE — Anesthesia Post-op Follow-up Note (Signed)
Anesthesia QCDR form completed.        

## 2018-05-18 NOTE — Plan of Care (Signed)
Reviewed plan of care with patient. All questions answered. Will monitor closely. 

## 2018-05-18 NOTE — Anesthesia Procedure Notes (Addendum)
Spinal  Start time: 05/18/2018 7:48 AM End time: 05/18/2018 7:55 AM Staffing Anesthesiologist: Yves Dillarroll, Paul, MD Resident/CRNA: Sherol DadeMacMang, Timarie Labell H, CRNA Performed: resident/CRNA  Preanesthetic Checklist Completed: patient identified, site marked, surgical consent, pre-op evaluation, timeout performed, IV checked, risks and benefits discussed and monitors and equipment checked Spinal Block Patient position: sitting Prep: ChloraPrep Patient monitoring: heart rate, continuous pulse ox and blood pressure Approach: midline Location: L3-4 Injection technique: single-shot Needle Needle type: Pencan  Needle gauge: 24 G Assessment Sensory level: T4 Additional Notes Pt tolerated well. Monitors and O2 placed prior to timeout and spinal placement. See flowsheet for agent dosage.

## 2018-05-19 LAB — CBC
HEMATOCRIT: 27.3 % — AB (ref 35.0–47.0)
Hemoglobin: 9.6 g/dL — ABNORMAL LOW (ref 12.0–16.0)
MCH: 31.9 pg (ref 26.0–34.0)
MCHC: 35.3 g/dL (ref 32.0–36.0)
MCV: 90.2 fL (ref 80.0–100.0)
Platelets: 161 10*3/uL (ref 150–440)
RBC: 3.02 MIL/uL — ABNORMAL LOW (ref 3.80–5.20)
RDW: 14.6 % — AB (ref 11.5–14.5)
WBC: 10.3 10*3/uL (ref 3.6–11.0)

## 2018-05-19 LAB — SURGICAL PATHOLOGY

## 2018-05-19 MED ORDER — FERROUS SULFATE 325 (65 FE) MG PO TABS
325.0000 mg | ORAL_TABLET | Freq: Two times a day (BID) | ORAL | Status: DC
  Administered 2018-05-19 – 2018-05-20 (×3): 325 mg via ORAL
  Filled 2018-05-19 (×3): qty 1

## 2018-05-19 MED ORDER — HYDROCODONE-ACETAMINOPHEN 5-325 MG PO TABS
1.0000 | ORAL_TABLET | ORAL | Status: DC | PRN
  Administered 2018-05-19 – 2018-05-20 (×6): 2 via ORAL
  Filled 2018-05-19 (×6): qty 2

## 2018-05-19 NOTE — Progress Notes (Signed)
Per nursing, patient reporting inadequate pain relief with scheduled ibuprofen and 2 tablets of Percocet (oxycodone/acetaminophen). Orders changed from 1-2 tablets Percocet q4h PRN to 1-2 tablets Norco (hydrocodone/acetaminophen) q4h PRN. Also advised abdominal band to decrease discomfort.   Genia DelMargaret Karimah Winquist 05/19/2018 1:56 PM

## 2018-05-19 NOTE — Lactation Note (Signed)
This note was copied from a baby's chart. Lactation Consultation Note  Patient Name: Brandi Brown Shireeara Birden ZOXWR'UToday's Date: 05/19/2018 Reason for consult: Follow-up assessment   Maternal Data    Feeding Feeding Type: Breast Fed Length of feed: 60 min(25 min Right side, 35 min Left side)  LATCH Score Latch: Repeated attempts needed to sustain latch, nipple held in mouth throughout feeding, stimulation needed to elicit sucking reflex.  Audible Swallowing: Spontaneous and intermittent  Type of Nipple: Everted at rest and after stimulation  Comfort (Breast/Nipple): Filling, red/small blisters or bruises, mild/mod discomfort  Hold (Positioning): Assistance needed to correctly position infant at breast and maintain latch.  LATCH Score: 7  Interventions Interventions: Assisted with latch  Lactation Tools Discussed/Used     Consult Status Consult Status: Follow-up Date: 05/19/18 Follow-up type: In-patient Mother states that infant breastfeeds well but is having trouble with positioning on right side. LC assisted with sandwiching the breast so that infant could get a deep latch using the cradle position. Infant was able to successfully latch and parents were educated on techniques to try for positioning and deeper latch.   Arlyss Gandylicia Lilliemae Fruge 05/19/2018, 4:37 PM

## 2018-05-19 NOTE — Progress Notes (Signed)
Post Op Day 1  Subjective: Doing well, though feels like pain could be better controlled. Ambulating without difficulty, tolerating regular diet, and voiding without difficulty.   No fever/chills, chest pain, shortness of breath, nausea/vomiting, or leg pain. Some nipple soreness related to baby's latch.   Objective: BP 96/67 (BP Location: Left Arm)   Pulse 74   Temp 98 F (36.7 C) (Oral)   Resp 18   Ht '5\' 2"'$  (1.575 m)   Wt 96.2 kg (212 lb)   LMP 08/15/2017 (Exact Date)   SpO2 99%   Breastfeeding? Unknown   BMI 38.78 kg/m    Physical Exam:  General: alert, cooperative, appears stated age and no distress Breasts: soft/nontender CV: RRR Pulm: nl effort, CTABL Abdomen: soft, non-tender, active bowel sounds Uterine Fundus: firm Incision: no significant drainage Lochia: appropriate DVT Evaluation: No evidence of DVT seen on physical exam. No cords or calf tenderness. No significant calf/ankle edema.  Recent Labs    05/18/18 0641 05/19/18 0538  HGB 11.4* 9.6*  HCT 32.5* 27.3*  WBC 10.5 10.3  PLT 194 161    Assessment/Plan: 32 y.o. G3P3003 postop day # 1  -Continue routine PP care -Lactation consult PRN for breastfeeding.  -Acute blood loss anemia - hemodynamically stable and asymptomatic; start PO ferrous sulfate BID with stool softeners  -Immunization status: Needs MMR prior to discharge, order in place.  -Continue scheduled ibuprofen and increase Percocet to 2 tablets q4h PRN instead of 1 tablet.   Disposition: Continue inpatient postpartum care.     LOS: 1 day   Lisette Grinder, CNM 05/19/2018, 8:36 AM   ----- Lisette Grinder Certified Nurse Midwife [facility]

## 2018-05-19 NOTE — Anesthesia Post-op Follow-up Note (Signed)
  Anesthesia Pain Follow-up Note  Patient: Brandi Brown  Day #: 1  Date of Follow-up: 05/19/2018 Time: 7:41 AM  Last Vitals:  Vitals:   05/18/18 1945 05/18/18 2344  BP: 107/70 102/70  Pulse: 79 78  Resp: 16 20  Temp: 36.8 C 36.7 C  SpO2: 98% 98%    Level of Consciousness: alert  Pain: none   Side Effects:None  Catheter Site Exam:clean, dry     Plan: D/C from anesthesia care at surgeon's request  [facility]ie Clorine Swing

## 2018-05-19 NOTE — Anesthesia Postprocedure Evaluation (Signed)
Anesthesia Post Note  Patient: Brandi Brown  Procedure(s) Performed: REPEAT CESAREAN SECTION WITH BILATERAL TUBAL LIGATION (Bilateral Abdomen)  Patient location during evaluation: Mother Baby Anesthesia Type: Spinal Level of consciousness: awake, awake and alert and oriented Pain management: pain level controlled Vital Signs Assessment: post-procedure vital signs reviewed and stable Respiratory status: spontaneous breathing, nonlabored ventilation and respiratory function stable Cardiovascular status: blood pressure returned to baseline and stable Postop Assessment: no headache, no backache and no apparent nausea or vomiting Anesthetic complications: no     Last Vitals:  Vitals:   05/18/18 1945 05/18/18 2344  BP: 107/70 102/70  Pulse: 79 78  Resp: 16 20  Temp: 36.8 C 36.7 C  SpO2: 98% 98%    Last Pain:  Vitals:   05/19/18 0700  TempSrc:   PainSc: 5                  Chiropodisttephanie Abdulrahim Siddiqi

## 2018-05-20 LAB — CBC
HCT: 26.2 % — ABNORMAL LOW (ref 35.0–47.0)
Hemoglobin: 9.1 g/dL — ABNORMAL LOW (ref 12.0–16.0)
MCH: 31.6 pg (ref 26.0–34.0)
MCHC: 34.7 g/dL (ref 32.0–36.0)
MCV: 91 fL (ref 80.0–100.0)
PLATELETS: 185 10*3/uL (ref 150–440)
RBC: 2.87 MIL/uL — ABNORMAL LOW (ref 3.80–5.20)
RDW: 15.2 % — ABNORMAL HIGH (ref 11.5–14.5)
WBC: 8.7 10*3/uL (ref 3.6–11.0)

## 2018-05-20 MED ORDER — SERTRALINE HCL 25 MG PO TABS
25.0000 mg | ORAL_TABLET | Freq: Every day | ORAL | Status: DC
  Administered 2018-05-20: 25 mg via ORAL
  Filled 2018-05-20: qty 1

## 2018-05-20 MED ORDER — IBUPROFEN 600 MG PO TABS: 600.0000 mg | ORAL_TABLET | Freq: Four times a day (QID) | ORAL | 1 refills | Status: DC

## 2018-05-20 MED ORDER — OXYCODONE HCL 5 MG PO TABS: 5.0000 mg | ORAL_TABLET | Freq: Three times a day (TID) | ORAL | 0 refills | Status: AC | PRN

## 2018-05-20 MED ORDER — SERTRALINE HCL 50 MG PO TABS: 50.0000 mg | ORAL_TABLET | Freq: Every day | ORAL | 11 refills | Status: DC

## 2018-05-20 MED ORDER — FERROUS SULFATE 325 (65 FE) MG PO TABS: 325.0000 mg | ORAL_TABLET | Freq: Every day | ORAL | 3 refills | Status: DC

## 2018-05-20 NOTE — Lactation Note (Signed)
This note was copied from a baby's chart. Lactation Consultation Note  Patient Name: Brandi Brown ZOXWR'UToday's Date: 05/20/2018 Reason for consult: Follow-up assessment   Mom c/o sore, cracked and bruised nipples since yesterday. She said first day was not painful. I asked her what she thought might have made a difference. She then realized she gave pacifier before yesterday morning. I explained how that may be part of the problem, but likely a combination of position and latching technique we can work on. She showed me how to positions and latches baby. Baby's mouth was a few inches too far from natural placement of nipple, so that could have also been part of the problem.  I noted proper tongue movement laterally, and beyond gumline. It does tend to "cup" a bit, but I'm not sure it would prevent proper latch.   Once we made corrections in football hold, [name] settled into a rhythmic suck swallow pattern that Mom said was not painful. I showed Dad how to unfurl lips if they curl inward (they did twice. Corrections made mom feel better each time). I encouraged them to encourage [name] to feed until no longer rooting.   I would like them to practice what was just learned at least a couple more times today before going home. I reviewed signs of adequate feedings/nourshment and when to get help with feeds. She has LC contact info and support group info   Maternal Data Does the patient have breastfeeding experience prior to this delivery?: Yes  Feeding Feeding Type: Breast Fed(football hold) Length of feed: 20 min  LATCH Score Latch: Grasps breast easily, tongue down, lips flanged, rhythmical sucking.(with asymmetrical, deep latch)  Audible Swallowing: Spontaneous and intermittent  Type of Nipple: Everted at rest and after stimulation  Comfort (Breast/Nipple): Filling, red/small blisters or bruises, mild/mod discomfort(bruises, cracked nipples)  Hold (Positioning): Assistance needed to  correctly position infant at breast and maintain latch.  LATCH Score: 8  Interventions Interventions: Breast feeding basics reviewed;Assisted with latch;Skin to skin;Breast compression;Adjust position;Support pillows;Position options;Expressed milk;Coconut oil;Comfort gels(avoid pacifier til healed adn consistent latch)  Lactation Tools Discussed/Used     Consult Status Consult Status: PRN(may go home today; OP consult info given) Follow-up type: Out-patient    Sunday CornSandra Clark Benecio Kluger 05/20/2018, 10:28 AM

## 2018-05-20 NOTE — Lactation Note (Signed)
This note was copied from a baby's chart. Lactation Consultation Note  Patient Name: Brandi Brown ZOXWR'UToday's Date: 05/20/2018 Reason for consult: Follow-up assessment   When I returned to room after baby had been nursing for 30 minutes, dad was getting ready to wrap up crying, rooting baby in wrap. I reminded them the signs of a satisfied baby. They realized she was still hungry and together positioned and latched baby on correctly to the right breast. Mom said it was "the best latch" she had had on that side since yesterday. Lots of swallows heard. Dad corrected tucked lip.RN Angelica ChessmanMandy just told me that baby had nursed another 20 minutes on right breast and had then fallen asleep. Parents said that was a "good feeding".    Maternal Data Does the patient have breastfeeding experience prior to this delivery?: Yes  Feeding Feeding Type: Breast Fed(football hold)  LATCH Score Latch: Grasps breast easily, tongue down, lips flanged, rhythmical sucking.(with asymmetrical, deep latch)  Audible Swallowing: Spontaneous and intermittent  Type of Nipple: Everted at rest and after stimulation  Comfort (Breast/Nipple): Filling, red/small blisters or bruises, mild/mod discomfort(bruises, cracked nipples)  Hold (Positioning): Assistance needed to correctly position infant at breast and maintain latch.  LATCH Score: 8  Interventions Interventions: Breast feeding basics reviewed;Assisted with latch;Skin to skin;Breast compression;Adjust position;Support pillows;Position options;Expressed milk;Coconut oil;Comfort gels(avoid pacifier til healed adn consistent latch)  Lactation Tools Discussed/Used     Consult Status Consult Status: PRN(may go home today; OP consult info given) Follow-up type: Out-patient    Sunday CornSandra Clark [name] 05/20/2018, 11:28 AM

## 2018-05-20 NOTE — Clinical Social Work Maternal (Signed)
  CLINICAL SOCIAL WORK MATERNAL/CHILD NOTE  Patient Details  Name: Brandi Brown MRN: [id number] Date of Birth: 01/02/1986  Date:  05/20/2018  Clinical Social Worker Initiating Note:  Shela Leff MSW,LCSW Date/Time: Initiated:  05/20/18/      Child's Name:      Biological Parents:  Mother, Father   Need for Interpreter:  None   Reason for Referral:  Behavioral Health Concerns   Address:  [address] [place] [address]    Phone number:  [phone] (home)     Additional phone number: none  Household Members/Support Persons (HM/SP):       HM/SP Name Relationship DOB or Age  HM/SP -1        HM/SP -2        HM/SP -3        HM/SP -4        HM/SP -5        HM/SP -6        HM/SP -7        HM/SP -8          Natural Supports (not living in the home):  Friends   Professional Supports: None   Employment:     Type of Work:     Education:      Homebound arranged:    Museum/gallery curator Resources:  Medicaid   Other Resources:      Cultural/Religious Considerations Which May Impact Care:  none  Strengths:  Ability to meet basic needs , Home prepared for child , Compliance with medical plan    Psychotropic Medications:         Pediatrician:       Pediatrician List:   [place]      Pediatrician Fax Number:    Risk Factors/Current Problems:  Mental Health Concerns    Cognitive State:  Alert , Able to Concentrate    Mood/Affect:  Calm , Relaxed , Flat    CSW Assessment: CSW asked to see patient by nurse who stated patient scored an 11 on the [place] Scale. CSW spoke with nurse and asked her to discuss with patient's physician that patient was on an antidepressant prior to pregnancy but stopped taking it during the pregnancy. CSW met with patient and her husband. This is their 4th child. They rely on each other for support and do not have many support  systems outside of themselves. Patient confirms she has been feeling down lately. She does not endorse suicidal or homicidal ideation at this time. CSW has given patient outpatient resources for counseling to patient and she was receptive. Patient's husband stated he will be keeping a close watch for any changes in his wife. The patient's nurse did inform CSW that the physician was going to begin patient on zoloft.   They have all necessities for their newborn and have no concerns or questions at this time.   CSW Plan/Description:  No Further Intervention Required/No Barriers to Discharge, Other Information/Referral to [facility], [place] 05/20/2018, 2:15 PM

## 2018-05-20 NOTE — Discharge Instructions (Signed)
Discharge instructions:   Zoloft:  Take 1/2 tablet for 6 days then take full tablet from then on.  Call office if you have any of the following: headache, visual changes, fever >101.0 F, chills, breast concerns, excessive vaginal bleeding, incision drainage or problems, leg pain or redness, depression or any other concerns.   Activity: Do not lift > 10 lbs for 6 weeks.  No intercourse or tampons for 6 weeks.  No driving for 1-2 weeks.   Call your doctor for increased pain or vaginal bleeding, temperature above 101.0, depression, or concerns.  No strenuous activity or heavy lifting for 6 weeks.  No intercourse, tampons, douching, or enemas for 6 weeks.  No tub baths-showers only.  No driving for 2 weeks or while taking pain medications.  Continue prenatal vitamin and iron.  Increase calories and fluids while breastfeeding.  You may have a slight fever when your milk comes in, but it should go away on its own.  If it does not, and rises above 101.0 please call the doctor.  For concerns about your baby, please call your pediatrician For breastfeeding concerns, the lactation consultant can be reached at [phone]    Cesarean Delivery, Care After Refer to this sheet in the next few weeks. These instructions provide you with information about caring for yourself after your procedure. Your health care provider may also give you more specific instructions. Your treatment has been planned according to current medical practices, but problems sometimes occur. Call your health care provider if you have any problems or questions after your procedure. What can I expect after the procedure? After the procedure, it is common to have:  A small amount of blood or clear fluid coming from the incision.  Some redness, swelling, and pain in your incision area.  Some abdominal pain and soreness.  Vaginal bleeding (lochia).  Pelvic cramps.  Fatigue.  Follow these instructions at home: Incision  care   Follow instructions from your health care provider about how to take care of your incision. Make sure you: ? Wash your hands with soap and water before you change your bandage (dressing). If soap and water are not available, use hand sanitizer. ? If you have a dressing, change it as told by your health care provider. ? Leave stitches (sutures), skin staples, skin glue, or adhesive strips in place. These skin closures may need to stay in place for 2 weeks or longer. If adhesive strip edges start to loosen and curl up, you may trim the loose edges. Do not remove adhesive strips completely unless your health care provider tells you to do that.  Check your incision area every day for signs of infection. Check for: ? More redness, swelling, or pain. ? More fluid or blood. ? Warmth. ? Pus or a bad smell.  When you cough or sneeze, hug a pillow. This helps with pain and decreases the chance of your incision opening up (dehiscing). Do this until your incision heals. Medicines  Take over-the-counter and prescription medicines only as told by your health care provider.  If you were prescribed an antibiotic medicine, take it as told by your health care provider. Do not stop taking the antibiotic until it is finished. Driving  Do not drive or operate heavy machinery while taking prescription pain medicine. Lifestyle  Do not drink alcohol. This is especially important if you are breastfeeding or taking pain medicine.  Do not use tobacco products, including cigarettes, chewing tobacco, or e-cigarettes. If you need help  quitting, ask your health care provider. Tobacco can delay wound healing. Eating and drinking  Drink at least 8 eight-ounce glasses of water every day unless told not to by your health care provider. If you breastfeed, you may need to drink more water than this.  Eat high-fiber foods every day. These foods may help prevent or relieve constipation. High-fiber foods  include: ? Whole grain cereals and breads. ? Brown rice. ? Beans. ? Fresh fruits and vegetables. Activity  Return to your normal activities as told by your health care provider. Ask your health care provider what activities are safe for you.  Rest as much as possible. Try to rest or take a nap while your baby is sleeping.  Do not lift anything that is heavier than your baby or 10 lb (4.5 kg) as told by your health care provider.  Ask your health care provider when you can engage in sexual activity. This may depend on your: ? Risk of infection. ? Healing rate. ? Comfort and desire to engage in sexual activity. Bathing  Do not take baths, swim, or use a hot tub until your health care provider approves. Ask your health care provider if you can take showers. You may only be allowed to take sponge baths until your incision heals. General instructions  Do not use tampons or douches until your health care provider approves.  Wear: ? Loose, comfortable clothing. ? A supportive and well-fitting bra.  Watch for any blood clots that may pass from your vagina. These may look like clumps of dark red, brown, or black discharge.  Keep your perineum clean and dry as told by your health care provider.  Wipe from front to back when you use the toilet.  If possible, have someone help you care for your baby and help with household activities for a few days after you leave the hospital.  Keep all follow-up visits for you and your baby as told by your health care provider. This is important. Contact a health care provider if:  You have: ? Bad-smelling vaginal discharge. ? Difficulty urinating. ? Pain when urinating. ? A sudden increase or decrease in the frequency of your bowel movements. ? More redness, swelling, or pain around your incision. ? More fluid or blood coming from your incision. ? Pus or a bad smell coming from your incision. ? A fever. ? A rash. ? Little or no interest in  activities you used to enjoy. ? Questions about caring for yourself or your baby. ? Nausea.  Your incision feels warm to the touch.  Your breasts turn red or become painful or hard.  You feel unusually sad or worried.  You vomit.  You pass large blood clots from your vagina. If you pass a blood clot, save it to show to your health care provider. Do not flush blood clots down the toilet without showing your health care provider.  You urinate more than usual.  You are dizzy or light-headed.  You have not breastfed and have not had a menstrual period for 12 weeks after delivery.  You stopped breastfeeding and have not had a menstrual period for 12 weeks after stopping breastfeeding. Get help right away if:  You have: ? Pain that does not go away or get better with medicine. ? Chest pain. ? Difficulty breathing. ? Blurred vision or spots in your vision. ? Thoughts about hurting yourself or your baby. ? New pain in your abdomen or in one of your legs. ? A  severe headache.  You faint.  You bleed from your vagina so much that you fill two sanitary pads in one hour. This information is not intended to replace advice given to you by your health care provider. Make sure you discuss any questions you have with your health care provider. Document Released: 06/22/2002 Document Revised: 11/02/2016 Document Reviewed: 09/04/2015 Elsevier Interactive Patient Education  [employer]2018 [employer]

## 2018-05-20 NOTE — Progress Notes (Signed)
Provided and reviewed discharge paperwork. Patient verbalized understanding and teach back method was utilized. Follow up appointment provided. Discharged with significant other and infant to go home. Taken to visitor entrance by hospital volunteer via wheelchair.

## 2018-05-20 NOTE — Progress Notes (Signed)
Provided period of purple cry video for mother and father of baby to watch. Addressed questions/concerns regarding information. Also provided copy of dvd for parents to take home.   

## 2018-07-16 DIAGNOSIS — B373 Candidiasis of vulva and vagina: Secondary | ICD-10-CM | POA: Diagnosis not present

## 2018-07-16 DIAGNOSIS — N898 Other specified noninflammatory disorders of vagina: Secondary | ICD-10-CM | POA: Diagnosis not present

## 2018-07-16 DIAGNOSIS — R102 Pelvic and perineal pain: Secondary | ICD-10-CM | POA: Diagnosis not present

## 2019-05-27 ENCOUNTER — Other Ambulatory Visit: Payer: Self-pay | Admitting: General Surgery

## 2019-05-27 DIAGNOSIS — K439 Ventral hernia without obstruction or gangrene: Secondary | ICD-10-CM

## 2019-06-04 ENCOUNTER — Ambulatory Visit: Admission: RE | Admit: 2019-06-04 | Payer: Medicaid Other | Source: Ambulatory Visit

## 2019-07-08 ENCOUNTER — Other Ambulatory Visit: Payer: Self-pay

## 2019-07-08 ENCOUNTER — Ambulatory Visit
Admission: RE | Admit: 2019-07-08 | Discharge: 2019-07-08 | Disposition: A | Discharge status: Home / Self Care | Payer: Medicaid Other | Source: Ambulatory Visit | Attending: General Surgery | Admitting: General Surgery

## 2019-07-08 ENCOUNTER — Encounter (INDEPENDENT_AMBULATORY_CARE_PROVIDER_SITE_OTHER): Payer: Self-pay

## 2019-07-08 DIAGNOSIS — K439 Ventral hernia without obstruction or gangrene: Secondary | ICD-10-CM | POA: Insufficient documentation

## 2019-07-08 MED ORDER — IOHEXOL 300 MG/ML  SOLN
100.0000 mL | Freq: Once | INTRAMUSCULAR | Status: AC | PRN
  Administered 2019-07-08: 100 mL via INTRAVENOUS

## 2019-07-13 ENCOUNTER — Ambulatory Visit: Payer: Self-pay | Admitting: General Surgery

## 2019-07-13 NOTE — H&P (View-Only) (Signed)
PATIENT PROFILE: Brandi Brown is a 33 y.o. female who presents to the Clinic for evaluation of ventral hernia.  PCP:  Lovie Macadamia, MD  HISTORY OF PRESENT ILLNESS: Brandi Brown reports previously evaluated due to abdominal pain.  There was a suspicious of a ventral hernia but was not able to be identified on physical exam.  Patient reports feeling to umbilical pain.  The pain radiates to the left side of the abdomen.  There is no alleviating or aggravating factor.  Patient denies nausea or vomiting the pain.  CT scan of the abdomen and pelvis was done showing a infraumbilical hernia with nonobstructed bowel inside the hernia.  There has been no major changes on her history or physical exam since last visit.   PROBLEM LIST:         Problem List  Date Reviewed: 05/18/2019         Noted   Uterine size date discrepancy, third trimester Unknown   History of cesarean delivery, currently pregnant 12/30/2017   Supervision of other high risk pregnancies, third trimester 10/28/2017   Overview    33 y.o. G3P2002 at [redacted]w[redacted]d by  LMP c/w [redacted]w[redacted]d ultrasound Sex of baby and name: girl " [name]"  Factors complicating this pregnancy  1. Rubella Non immune  Vaccinate Post partum 2. History of LTCS x2  plans repeat- scheduled for 8/5 with [facility] 3. Depression ? Takes Wellbutrin and Effexor 4. Hx LEEP   US:10/23/17: 9 6/7 weeks Cx Length:3.99 cm  12/30/17: CL:4.21xm, no funneling  Screening results and needs: ? NOB:   MBT  O+  Ab screen Neg   Pap  HIV  Neg  Hep B/RPR Neg/NR, GC/C Neg  Rubella  Non Immune   VZV Immune, P/C ratio 110, Early Glucola 131 ? Aneuploidy:   First trimester: (Did they do cffDNA or NT/blood draw?) declined  Informaseq:   NT:      Second trimester (AFP/tetra): declined ? 28 weeks:   Blood consent: signed 02/25/18  Hgb: 11.3  Plt count: 210   Glucola:  Rhogam: n/a ? 36 weeks:   GBS: neg  G/C: neg  Hgb:12.1   RPR: Pending  HIV: pending ? Last [place]:    11/11/17 Cervical Length = 4.21 cm, No funneling seen, FHR= 163 bpm, CRl= 6.12 cm= 12.4 weeks CWD, OVS not seen  12/30/2017: Anatomy normal, vertex, EFW: 273g 10oz 20%, FHR: 144bpm, Placenta posterior, Adnexa: B/L OVs imaged, appear WNL, CL: 4.63cm, closed  03/05/18: AFI-148 mm @50 %, EFW-1248 g @47 %, vertex, post plac  Immunization:   ? Flu in season -  ? Tdap at 27-36 weeks - given 02/25/18  Social: no  changes  Contraception Plan: BTL, signed tubal consent 02/25/18  Feeding Plan: breast       History of loop electrosurgical excision procedure (LEEP) of cervix affecting pregnancy, antepartum 09/24/2017   Overview    LEEP done 07/31/17 by TJS -Pelvic rest until further notice per d/w BEB -serial cervical lengths- transvag US scheduled around [redacted]wks GA for initial CL and dating.  10/08/17: GA [redacted]w[redacted]d  CL 4.40cm 10/23/17: GA [redacted]w[redacted]d CL 3.99cm       Depression Unknown   Attention deficit disorder Unknown      GENERAL REVIEW OF SYSTEMS:   General ROS: negative for - chills, fatigue, fever, weight gain or weight loss Allergy and Immunology ROS: negative for - hives  Hematological and Lymphatic ROS: negative for - bleeding problems or bruising, negative for palpable nodes Endocrine ROS: negative for -  heat or cold intolerance, hair changes Respiratory ROS: negative for - cough, shortness of breath or wheezing Cardiovascular ROS: no chest pain or palpitations GI ROS: negative for nausea, vomiting, diarrhea, constipation.  Positive for abdominal pain Musculoskeletal ROS: negative for - joint swelling or muscle pain Neurological ROS: negative for - confusion, syncope Dermatological ROS: negative for pruritus and rash Psychiatric: negative for anxiety, depression, difficulty sleeping and memory loss  MEDICATIONS: Current Medications        Current Outpatient Medications  Medication Sig Dispense Refill  . dextroamphetamine-amphetamine (ADDERALL) 30 mg tablet Take 1  tablet (30 mg total) by mouth 2 (two) times daily for 30 days 60 tablet 0  . FLUoxetine (PROZAC) 40 MG capsule Take 1 capsule (40 mg total) by mouth once daily 30 capsule 11  . ibuprofen (ADVIL,MOTRIN) 600 MG tablet Take 1 tablet (600 mg total) by mouth every 6 (six) hours 65 tablet 0  . dextroamphetamine-amphetamine (ADDERALL) 30 mg tablet Take 1 tablet (30 mg total) by mouth 2 (two) times daily for 30 days 60 tablet 0   No current facility-administered medications for this visit.       ALLERGIES: Bee pollen and Bee venom protein (honey bee)  PAST MEDICAL HISTORY:     Past Medical History:  Diagnosis Date  . Attention deficit disorder without mention of hyperactivity   . Depression     PAST SURGICAL HISTORY:      Past Surgical History:  Procedure Laterality Date  . CESAREAN SECTION  05/2018  . COLPOSCOPY  2006     FAMILY HISTORY:      Family History  Problem Relation Age of Onset  . Thyroid disease Mother   . No Known Problems Father   . No Known Problems Sister   . No Known Problems Son   . No Known Problems Maternal Grandmother   . No Known Problems Maternal Grandfather   . No Known Problems Paternal Grandmother   . No Known Problems Paternal Grandfather   . No Known Problems Son   . No Known Problems Sister   . No Known Problems Sister      SOCIAL HISTORY: Social History          Socioeconomic History  . Marital status: Single    Spouse name: Not on file  . Number of children: Not on file  . Years of education: Not on file  . Highest education level: Not on file  Occupational History  . Not on file  Social Needs  . Financial resource strain: Not on file  . Food insecurity    Worry: Not on file    Inability: Not on file  . Transportation needs    Medical: Not on file    Non-medical: Not on file  Tobacco Use  . Smoking status: Never Smoker  . Smokeless tobacco: Never Used  Substance and Sexual Activity  . Alcohol  use: Yes    Alcohol/week: 0.0 standard drinks    Frequency: Monthly or less    Comment: Occasional  . Drug use: No  . Sexual activity: Yes    Partners: Male    Birth control/protection: Surgical  Other Topics Concern  . Not on file  Social History Narrative  . Not on file      PHYSICAL EXAM:    Vitals:   07/13/19 0909  BP: 111/67  Pulse: 90   Body mass index is 40.24 kg/m. Weight: 99.8 kg (220 lb)   GENERAL: Alert, active, oriented x3  HEENT: Pupils equal reactive to light. Extraocular movements are intact. Sclera clear. Palpebral conjunctiva normal red color.Pharynx clear.  NECK: Supple with no palpable mass and no adenopathy.  LUNGS: Sound clear with no rales rhonchi or wheezes.  HEART: Regular rhythm S1 and S2 without murmur.  ABDOMEN: Soft and depressible, nontender with no palpable mass, no hepatomegaly.  Unable to palpate a hernia infraumbilically, soft, mild tender on palpation infraumbilically.  EXTREMITIES: Well-developed well-nourished symmetrical with no dependent edema.  NEUROLOGICAL: Awake alert oriented, facial expression symmetrical, moving all extremities.  REVIEW OF DATA: I have reviewed the following data today:      Initial consult on 05/27/2019  Component Date Value  . Glucose 05/27/2019 85   . Sodium 05/27/2019 139   . Potassium 05/27/2019 4.2   . Chloride 05/27/2019 104   . Carbon Dioxide (CO2) 05/27/2019 30.0   . Calcium 05/27/2019 9.7   . Urea Nitrogen (BUN) 05/27/2019 14   . Creatinine 05/27/2019 0.8   . Glomerular Filtration Ra* 05/27/2019 82   . BUN/Crea Ratio 05/27/2019 17.5   . Anion Gap w/K 05/27/2019 9.2      ASSESSMENT: Ms. Shellee MiloMobley is a 33 y.o. female presenting for consultation for ventral hernia.    The patient presents with a symptomatic, reducible ventral hernia. Patient was oriented about the diagnosis of inguinal hernia and its implication. The patient was oriented about the treatment  alternatives (observation vs surgical repair). Due to patient symptoms, repair is recommended. Patient oriented about the surgical procedure, the use of mesh and its risk of complications such as: infection, bleeding, injury to bowel or bladder, fistula and chronic pain.  Ventral hernia without obstruction or gangrene [K43.9]  PLAN: 1. Robotic assisted laparoscopic ventral hernia repair with mesh ([id number]) 2. CBC 3. Avoid taking aspirin of blood thinners 5 days before surgery 4. Contact us if has any question or concern  Patient verbalized understanding, all questions were answered, and were agreeable with the plan outlined above.   This was a 40-minute encounter most of the time counseling the patient and coordinating plan of care.  Carolan ShiverEdgardo Cintron-Diaz, MD  Electronically signed by Carolan ShiverEdgardo Cintron-Diaz, MD

## 2019-07-13 NOTE — H&P (Signed)
PATIENT PROFILE: Brandi Brown is a 33 y.o. female who presents to the Clinic for evaluation of ventral hernia.  PCP:  Lovie Macadamia, MD  HISTORY OF PRESENT ILLNESS: Brandi Brown reports previously evaluated due to abdominal pain.  There was a suspicious of a ventral hernia but was not able to be identified on physical exam.  Patient reports feeling to umbilical pain.  The pain radiates to the left side of the abdomen.  There is no alleviating or aggravating factor.  Patient denies nausea or vomiting the pain.  CT scan of the abdomen and pelvis was done showing a infraumbilical hernia with nonobstructed bowel inside the hernia.  There has been no major changes on her history or physical exam since last visit.   PROBLEM LIST:         Problem List  Date Reviewed: 05/18/2019         Noted   Uterine size date discrepancy, third trimester Unknown   History of cesarean delivery, currently pregnant 12/30/2017   Supervision of other high risk pregnancies, third trimester 10/28/2017   Overview    33 y.o. G3P2002 at [redacted]w[redacted]d by  LMP c/w [redacted]w[redacted]d ultrasound Sex of baby and name: girl " Brandi Brown"  Factors complicating this pregnancy  1. Rubella Non immune  Vaccinate Post partum 2. History of LTCS x2  plans repeat- scheduled for 8/5 with [facility] 3. Depression ? Takes Wellbutrin and Effexor 4. Hx LEEP   US:10/23/17: 9 6/7 weeks Cx Length:3.99 cm  12/30/17: CL:4.21xm, no funneling  Screening results and needs: ? NOB:   MBT  O+  Ab screen Neg   Pap  HIV  Neg  Hep B/RPR Neg/NR, GC/C Neg  Rubella  Non Immune   VZV Immune, P/C ratio 110, Early Glucola 131 ? Aneuploidy:   First trimester: (Did they do cffDNA or NT/blood draw?) declined  Informaseq:   NT:      Second trimester (AFP/tetra): declined ? 28 weeks:   Blood consent: signed 02/25/18  Hgb: 11.3  Plt count: 210   Glucola:  Rhogam: n/a ? 36 weeks:   GBS: neg  G/C: neg  Hgb:12.1   RPR: Pending  HIV: pending ? Last [place]:    11/11/17 Cervical Length = 4.21 cm, No funneling seen, FHR= 163 bpm, CRl= 6.12 cm= 12.4 weeks CWD, OVS not seen  12/30/2017: Anatomy normal, vertex, EFW: 273g 10oz 20%, FHR: 144bpm, Placenta posterior, Adnexa: B/L OVs imaged, appear WNL, CL: 4.63cm, closed  03/05/18: AFI-148 mm @50 %, EFW-1248 g @47 %, vertex, post plac  Immunization:   ? Flu in season -  ? Tdap at 27-36 weeks - given 02/25/18  Social: no  changes  Contraception Plan: BTL, signed tubal consent 02/25/18  Feeding Plan: breast       History of loop electrosurgical excision procedure (LEEP) of cervix affecting pregnancy, antepartum 09/24/2017   Overview    LEEP done 07/31/17 by TJS -Pelvic rest until further notice per d/w BEB -serial cervical lengths- transvag US scheduled around [redacted]wks GA for initial CL and dating.  10/08/17: [place] [redacted]w[redacted]d  CL 4.40cm 10/23/17: GA [redacted]w[redacted]d CL 3.99cm       Depression Unknown   Attention deficit disorder Unknown      GENERAL REVIEW OF SYSTEMS:   General ROS: negative for - chills, fatigue, fever, weight gain or weight loss Allergy and Immunology ROS: negative for - hives  Hematological and Lymphatic ROS: negative for - bleeding problems or bruising, negative for palpable nodes Endocrine ROS: negative for -  heat or cold intolerance, hair changes Respiratory ROS: negative for - cough, shortness of breath or wheezing Cardiovascular ROS: no chest pain or palpitations GI ROS: negative for nausea, vomiting, diarrhea, constipation.  Positive for abdominal pain Musculoskeletal ROS: negative for - joint swelling or muscle pain Neurological ROS: negative for - confusion, syncope Dermatological ROS: negative for pruritus and rash Psychiatric: negative for anxiety, depression, difficulty sleeping and memory loss  MEDICATIONS: Current Medications        Current Outpatient Medications  Medication Sig Dispense Refill  . dextroamphetamine-amphetamine (ADDERALL) 30 mg tablet Take 1  tablet (30 mg total) by mouth 2 (two) times daily for 30 days 60 tablet 0  . FLUoxetine (PROZAC) 40 MG capsule Take 1 capsule (40 mg total) by mouth once daily 30 capsule 11  . ibuprofen (ADVIL,MOTRIN) 600 MG tablet Take 1 tablet (600 mg total) by mouth every 6 (six) hours 65 tablet 0  . dextroamphetamine-amphetamine (ADDERALL) 30 mg tablet Take 1 tablet (30 mg total) by mouth 2 (two) times daily for 30 days 60 tablet 0   No current facility-administered medications for this visit.       ALLERGIES: Bee pollen and Bee venom protein (honey bee)  PAST MEDICAL HISTORY:     Past Medical History:  Diagnosis Date  . Attention deficit disorder without mention of hyperactivity   . Depression     PAST SURGICAL HISTORY:      Past Surgical History:  Procedure Laterality Date  . CESAREAN SECTION  05/2018  . COLPOSCOPY  2006     FAMILY HISTORY:      Family History  Problem Relation Age of Onset  . Thyroid disease Mother   . No Known Problems Father   . No Known Problems Sister   . No Known Problems Son   . No Known Problems Maternal Grandmother   . No Known Problems Maternal Grandfather   . No Known Problems Paternal Grandmother   . No Known Problems Paternal Grandfather   . No Known Problems Son   . No Known Problems Sister   . No Known Problems Sister      SOCIAL HISTORY: Social History          Socioeconomic History  . Marital status: Single    Spouse name: Not on file  . Number of children: Not on file  . Years of education: Not on file  . Highest education level: Not on file  Occupational History  . Not on file  Social Needs  . Financial resource strain: Not on file  . Food insecurity    Worry: Not on file    Inability: Not on file  . Transportation needs    Medical: Not on file    Non-medical: Not on file  Tobacco Use  . Smoking status: Never Smoker  . Smokeless tobacco: Never Used  Substance and Sexual Activity  . Alcohol  use: Yes    Alcohol/week: 0.0 standard drinks    Frequency: Monthly or less    Comment: Occasional  . Drug use: No  . Sexual activity: Yes    Partners: Male    Birth control/protection: Surgical  Other Topics Concern  . Not on file  Social History Narrative  . Not on file      PHYSICAL EXAM:    Vitals:   07/13/19 0909  BP: 111/67  Pulse: 90   Body mass index is 40.24 kg/m. Weight: 99.8 kg (220 lb)   GENERAL: Alert, active, oriented x3  HEENT: Pupils equal reactive to light. Extraocular movements are intact. Sclera clear. Palpebral conjunctiva normal red color.Pharynx clear.  NECK: Supple with no palpable mass and no adenopathy.  LUNGS: Sound clear with no rales rhonchi or wheezes.  HEART: Regular rhythm S1 and S2 without murmur.  ABDOMEN: Soft and depressible, nontender with no palpable mass, no hepatomegaly.  Unable to palpate a hernia infraumbilically, soft, mild tender on palpation infraumbilically.  EXTREMITIES: Well-developed well-nourished symmetrical with no dependent edema.  NEUROLOGICAL: Awake alert oriented, facial expression symmetrical, moving all extremities.  REVIEW OF DATA: I have reviewed the following data today:      Initial consult on 05/27/2019  Component Date Value  . Glucose 05/27/2019 85   . Sodium 05/27/2019 139   . Potassium 05/27/2019 4.2   . Chloride 05/27/2019 104   . Carbon Dioxide (CO2) 05/27/2019 30.0   . Calcium 05/27/2019 9.7   . Urea Nitrogen (BUN) 05/27/2019 14   . Creatinine 05/27/2019 0.8   . Glomerular Filtration Ra* 05/27/2019 82   . BUN/Crea Ratio 05/27/2019 17.5   . Anion Gap w/K 05/27/2019 9.2      ASSESSMENT: Ms. Katayama is a 33 y.o. female presenting for consultation for ventral hernia.    The patient presents with a symptomatic, reducible ventral hernia. Patient was oriented about the diagnosis of inguinal hernia and its implication. The patient was oriented about the treatment  alternatives (observation vs surgical repair). Due to patient symptoms, repair is recommended. Patient oriented about the surgical procedure, the use of mesh and its risk of complications such as: infection, bleeding, injury to bowel or bladder, fistula and chronic pain.  Ventral hernia without obstruction or gangrene [K43.9]  PLAN: 1. Robotic assisted laparoscopic ventral hernia repair with mesh ([id number]) 2. CBC 3. Avoid taking aspirin of blood thinners 5 days before surgery 4. Contact us if has any question or concern  Patient verbalized understanding, all questions were answered, and were agreeable with the plan outlined above.   This was a 40-minute encounter most of the time counseling the patient and coordinating plan of care.  Stephanine Reas Cintron-Diaz, MD  Electronically signed by Advait Buice Cintron-Diaz, MD  

## 2019-07-14 ENCOUNTER — Encounter
Admission: RE | Admit: 2019-07-14 | Discharge: 2019-07-14 | Disposition: A | Discharge status: Home / Self Care | Payer: Medicaid Other | Source: Ambulatory Visit | Attending: General Surgery | Admitting: General Surgery

## 2019-07-14 ENCOUNTER — Other Ambulatory Visit: Payer: Self-pay

## 2019-07-14 HISTORY — DX: Depression, unspecified: F32.A

## 2019-07-14 HISTORY — DX: Anxiety disorder, unspecified: F41.9

## 2019-07-14 NOTE — Patient Instructions (Addendum)
Your procedure is scheduled on: Monday 07/19/19.  Report to DAY SURGERY DEPARTMENT LOCATED ON 2ND FLOOR MEDICAL MALL ENTRANCE. To find out your arrival time please call [phone] between 1PM - 3PM on Friday  07/16/19.   Remember: Instructions that are not followed completely may result in serious medical risk, up to and including death, or upon the discretion of your surgeon and anesthesiologist your surgery may need to be rescheduled.      _X__ 1. Do not eat food after midnight the night before your procedure.                 No gum chewing or hard candies. You may drink clear liquids up to 2 hours                 before you are scheduled to arrive for your surgery- DO NOT drink clear                 liquids within 2 hours of the start of your surgery.                 Clear Liquids include:  water, apple juice without pulp, clear carbohydrate                 drink such as Clearfast or Gatorade, Black Coffee or Tea (Do not add                 milk or creamer to coffee or tea).    __X__2.  On the morning of surgery brush your teeth with toothpaste and water, you may rinse your mouth with mouthwash if you wish.  Do not swallow any toothpaste or mouthwash.       _X__ 3.  No Alcohol for 24 hours before or after surgery.     _X__ 4.  Do Not Smoke or use e-cigarettes For 24 Hours Prior to Your Surgery.                 Do not use any chewable tobacco products for at least 6 hours prior to                 Surgery.     __X__5.  Notify your doctor if there is any change in your medical condition      (cold, fever, infections).       Do not wear jewelry, make-up, hairpins, clips or nail polish. Do not wear lotions, powders, or perfumes.  Do not shave 48 hours prior to surgery. Men may shave face and neck. Do not bring valuables to the hospital.      [facility] is not responsible for any belongings or valuables.    Contacts, dentures/partials or body piercings may not be worn  into surgery. Bring a case for your contacts, glasses or hearing aids, a denture cup will be supplied.     Patients discharged the day of surgery will not be allowed to drive home.     Please read over the following fact sheets that you were given:   MRSA Information   __X__ Take these medicines the morning of surgery with A SIP OF WATER:     1. FLUoxetine (PROZAC) 40 MG capsule     __X__ Use CHG Soap as directed    __X__ Stop Anti-inflammatories 7 days before surgery such as Advil, Ibuprofen, Motrin, BC or Goodies Powder, Naprosyn, Naproxen, Aleve, Aspirin, Meloxicam. May take Tylenol if needed for pain or discomfort.  __X__ Don't start taking any new supplements before your procedure.

## 2019-07-15 ENCOUNTER — Other Ambulatory Visit
Admission: RE | Admit: 2019-07-15 | Discharge: 2019-07-15 | Disposition: A | Discharge status: Home / Self Care | Payer: Medicaid Other | Source: Ambulatory Visit | Attending: General Surgery | Admitting: General Surgery

## 2019-07-15 DIAGNOSIS — Z01812 Encounter for preprocedural laboratory examination: Secondary | ICD-10-CM | POA: Diagnosis present

## 2019-07-15 DIAGNOSIS — Z20828 Contact with and (suspected) exposure to other viral communicable diseases: Secondary | ICD-10-CM | POA: Insufficient documentation

## 2019-07-15 LAB — CBC
HCT: 37.3 % (ref 36.0–46.0)
Hemoglobin: 12.6 g/dL (ref 12.0–15.0)
MCH: 31.4 pg (ref 26.0–34.0)
MCHC: 33.8 g/dL (ref 30.0–36.0)
MCV: 93 fL (ref 80.0–100.0)
Platelets: 245 10*3/uL (ref 150–400)
RBC: 4.01 MIL/uL (ref 3.87–5.11)
RDW: 13.8 % (ref 11.5–15.5)
WBC: 7 10*3/uL (ref 4.0–10.5)
nRBC: 0 % (ref 0.0–0.2)

## 2019-07-15 LAB — SARS CORONAVIRUS 2 (TAT 6-24 HRS): SARS Coronavirus 2: NEGATIVE

## 2019-07-19 ENCOUNTER — Ambulatory Visit: Payer: Medicaid Other | Admitting: Certified Registered Nurse Anesthetist

## 2019-07-19 ENCOUNTER — Ambulatory Visit
Admission: RE | Admit: 2019-07-19 | Discharge: 2019-07-19 | Disposition: A | Discharge status: Home / Self Care | Payer: Medicaid Other | Attending: General Surgery | Admitting: General Surgery

## 2019-07-19 ENCOUNTER — Other Ambulatory Visit: Payer: Self-pay

## 2019-07-19 ENCOUNTER — Encounter
Admission: RE | Disposition: A | Discharge status: Home / Self Care | Payer: Self-pay | Source: Home / Self Care | Attending: General Surgery

## 2019-07-19 DIAGNOSIS — F988 Other specified behavioral and emotional disorders with onset usually occurring in childhood and adolescence: Secondary | ICD-10-CM | POA: Diagnosis not present

## 2019-07-19 DIAGNOSIS — Z79899 Other long term (current) drug therapy: Secondary | ICD-10-CM | POA: Diagnosis not present

## 2019-07-19 DIAGNOSIS — K432 Incisional hernia without obstruction or gangrene: Secondary | ICD-10-CM | POA: Insufficient documentation

## 2019-07-19 DIAGNOSIS — F329 Major depressive disorder, single episode, unspecified: Secondary | ICD-10-CM | POA: Diagnosis not present

## 2019-07-19 HISTORY — PX: XI ROBOTIC ASSISTED VENTRAL HERNIA: SHX6789

## 2019-07-19 LAB — POCT PREGNANCY, URINE: Preg Test, Ur: NEGATIVE

## 2019-07-19 SURGERY — REPAIR, HERNIA, VENTRAL, ROBOT-ASSISTED
Anesthesia: General | Site: Abdomen

## 2019-07-19 MED ORDER — ROCURONIUM BROMIDE 50 MG/5ML IV SOLN
INTRAVENOUS | Status: AC
  Filled 2019-07-19: qty 1

## 2019-07-19 MED ORDER — ACETAMINOPHEN 10 MG/ML IV SOLN
INTRAVENOUS | Status: DC | PRN
  Administered 2019-07-19: 1000 mg via INTRAVENOUS

## 2019-07-19 MED ORDER — FENTANYL CITRATE (PF) 100 MCG/2ML IJ SOLN: 25.0000 ug | INTRAMUSCULAR | Status: DC | PRN

## 2019-07-19 MED ORDER — LIDOCAINE HCL (CARDIAC) PF 100 MG/5ML IV SOSY
PREFILLED_SYRINGE | INTRAVENOUS | Status: DC | PRN
  Administered 2019-07-19: 100 mg via INTRAVENOUS

## 2019-07-19 MED ORDER — BUPIVACAINE-EPINEPHRINE 0.5% -1:200000 IJ SOLN
INTRAMUSCULAR | Status: DC | PRN
  Administered 2019-07-19: 15 mL

## 2019-07-19 MED ORDER — FAMOTIDINE 20 MG PO TABS
ORAL_TABLET | ORAL | Status: AC
  Administered 2019-07-19: 13:00:00 20 mg
  Filled 2019-07-19: qty 1

## 2019-07-19 MED ORDER — FENTANYL CITRATE (PF) 100 MCG/2ML IJ SOLN
INTRAMUSCULAR | Status: AC
  Filled 2019-07-19: qty 2

## 2019-07-19 MED ORDER — HYDROCODONE-ACETAMINOPHEN 5-325 MG PO TABS: 1.0000 | ORAL_TABLET | ORAL | 0 refills | Status: AC | PRN

## 2019-07-19 MED ORDER — MEPERIDINE HCL 50 MG/ML IJ SOLN: 6.2500 mg | INTRAMUSCULAR | Status: DC | PRN

## 2019-07-19 MED ORDER — OXYCODONE HCL 5 MG PO TABS
ORAL_TABLET | ORAL | Status: AC
  Filled 2019-07-19: qty 1

## 2019-07-19 MED ORDER — BUPIVACAINE-EPINEPHRINE (PF) 0.5% -1:200000 IJ SOLN
INTRAMUSCULAR | Status: AC
  Filled 2019-07-19: qty 30

## 2019-07-19 MED ORDER — ONDANSETRON HCL 4 MG/2ML IJ SOLN
INTRAMUSCULAR | Status: AC
  Filled 2019-07-19: qty 2

## 2019-07-19 MED ORDER — OXYCODONE HCL 5 MG/5ML PO SOLN: 5.0000 mg | Freq: Once | ORAL | Status: AC | PRN

## 2019-07-19 MED ORDER — FENTANYL CITRATE (PF) 100 MCG/2ML IJ SOLN
INTRAMUSCULAR | Status: DC | PRN
  Administered 2019-07-19: 25 ug via INTRAVENOUS
  Administered 2019-07-19: 100 ug via INTRAVENOUS
  Administered 2019-07-19: 25 ug via INTRAVENOUS
  Administered 2019-07-19: 50 ug via INTRAVENOUS

## 2019-07-19 MED ORDER — OXYCODONE HCL 5 MG PO TABS
5.0000 mg | ORAL_TABLET | Freq: Once | ORAL | Status: AC | PRN
  Administered 2019-07-19: 17:00:00 5 mg via ORAL

## 2019-07-19 MED ORDER — PROMETHAZINE HCL 25 MG/ML IJ SOLN: 6.2500 mg | INTRAMUSCULAR | Status: DC | PRN

## 2019-07-19 MED ORDER — ONDANSETRON HCL 4 MG/2ML IJ SOLN
INTRAMUSCULAR | Status: DC | PRN
  Administered 2019-07-19: 4 mg via INTRAVENOUS

## 2019-07-19 MED ORDER — SUGAMMADEX SODIUM 200 MG/2ML IV SOLN
INTRAVENOUS | Status: DC | PRN
  Administered 2019-07-19: 200 mg via INTRAVENOUS

## 2019-07-19 MED ORDER — MIDAZOLAM HCL 2 MG/2ML IJ SOLN
INTRAMUSCULAR | Status: AC
  Filled 2019-07-19: qty 2

## 2019-07-19 MED ORDER — MIDAZOLAM HCL 2 MG/2ML IJ SOLN
INTRAMUSCULAR | Status: DC | PRN
  Administered 2019-07-19: 2 mg via INTRAVENOUS

## 2019-07-19 MED ORDER — SUCCINYLCHOLINE CHLORIDE 20 MG/ML IJ SOLN
INTRAMUSCULAR | Status: AC
  Filled 2019-07-19: qty 1

## 2019-07-19 MED ORDER — CEFAZOLIN SODIUM-DEXTROSE 2-3 GM-%(50ML) IV SOLR
INTRAVENOUS | Status: DC | PRN
  Administered 2019-07-19: 2 g via INTRAVENOUS

## 2019-07-19 MED ORDER — KETOROLAC TROMETHAMINE 30 MG/ML IJ SOLN
INTRAMUSCULAR | Status: DC | PRN
  Administered 2019-07-19: 30 mg via INTRAVENOUS

## 2019-07-19 MED ORDER — SUGAMMADEX SODIUM 200 MG/2ML IV SOLN
INTRAVENOUS | Status: AC
  Filled 2019-07-19: qty 2

## 2019-07-19 MED ORDER — LIDOCAINE HCL (PF) 2 % IJ SOLN
INTRAMUSCULAR | Status: AC
  Filled 2019-07-19: qty 10

## 2019-07-19 MED ORDER — LACTATED RINGERS IV SOLN
INTRAVENOUS | Status: DC | PRN
  Administered 2019-07-19: 13:00:00 via INTRAVENOUS

## 2019-07-19 MED ORDER — PROPOFOL 10 MG/ML IV BOLUS
INTRAVENOUS | Status: DC | PRN
  Administered 2019-07-19: 180 mg via INTRAVENOUS

## 2019-07-19 MED ORDER — DEXAMETHASONE SODIUM PHOSPHATE 10 MG/ML IJ SOLN
INTRAMUSCULAR | Status: DC | PRN
  Administered 2019-07-19: 10 mg via INTRAVENOUS

## 2019-07-19 MED ORDER — CEFAZOLIN SODIUM-DEXTROSE 2-4 GM/100ML-% IV SOLN
INTRAVENOUS | Status: AC
  Filled 2019-07-19: qty 100

## 2019-07-19 MED ORDER — PROPOFOL 10 MG/ML IV BOLUS
INTRAVENOUS | Status: AC
  Filled 2019-07-19: qty 20

## 2019-07-19 MED ORDER — ACETAMINOPHEN 10 MG/ML IV SOLN
INTRAVENOUS | Status: AC
  Filled 2019-07-19: qty 100

## 2019-07-19 MED ORDER — KETOROLAC TROMETHAMINE 30 MG/ML IJ SOLN
INTRAMUSCULAR | Status: AC
  Filled 2019-07-19: qty 1

## 2019-07-19 MED ORDER — DEXAMETHASONE SODIUM PHOSPHATE 10 MG/ML IJ SOLN
INTRAMUSCULAR | Status: AC
  Filled 2019-07-19: qty 1

## 2019-07-19 MED ORDER — ROCURONIUM BROMIDE 100 MG/10ML IV SOLN
INTRAVENOUS | Status: DC | PRN
  Administered 2019-07-19: 10 mg via INTRAVENOUS
  Administered 2019-07-19: 50 mg via INTRAVENOUS

## 2019-07-19 SURGICAL SUPPLY — 59 items
BLADE SURG SZ11 CARB STEEL (BLADE) ×2 IMPLANT
CANISTER SUCT 1200ML W/VALVE (MISCELLANEOUS) ×2 IMPLANT
CANNULA REDUC XI 12-8 STAPL (CANNULA) ×2
CANNULA REDUCER 12-8 DVNC XI (CANNULA) ×1 IMPLANT
CHLORAPREP W/TINT 26 (MISCELLANEOUS) ×2 IMPLANT
COVER TIP SHEARS 8 DVNC (MISCELLANEOUS) ×1 IMPLANT
COVER TIP SHEARS 8MM DA VINCI (MISCELLANEOUS) ×1
COVER WAND RF STERILE (DRAPES) ×2 IMPLANT
DEFOGGER SCOPE WARMER CLEARIFY (MISCELLANEOUS) ×2 IMPLANT
DERMABOND ADVANCED (GAUZE/BANDAGES/DRESSINGS) ×1
DERMABOND ADVANCED .7 DNX12 (GAUZE/BANDAGES/DRESSINGS) ×1 IMPLANT
DRAPE 3/4 80X56 (DRAPES) ×2 IMPLANT
DRAPE ARM DVNC X/XI (DISPOSABLE) ×4 IMPLANT
DRAPE COLUMN DVNC XI (DISPOSABLE) ×1 IMPLANT
DRAPE DA VINCI XI ARM (DISPOSABLE) ×4
DRAPE DA VINCI XI COLUMN (DISPOSABLE) ×1
ELECT CAUTERY BLADE 6.4 (BLADE) ×2 IMPLANT
ELECT REM PT RETURN 9FT ADLT (ELECTROSURGICAL) ×2
ELECTRODE REM PT RTRN 9FT ADLT (ELECTROSURGICAL) ×1 IMPLANT
ETHIBOND 2 0 GREEN CT 2 30IN (SUTURE) ×2 IMPLANT
GLOVE BIOGEL PI IND STRL 7.0 (GLOVE) ×1 IMPLANT
GLOVE BIOGEL PI INDICATOR 7.0 (GLOVE) ×3
GLOVE SURG SYN 6.5 ES PF (GLOVE) ×6 IMPLANT
GLOVE SURG SYN 6.5 PF PI (GLOVE) ×1 IMPLANT
GOWN STRL REUS W/ TWL LRG LVL3 (GOWN DISPOSABLE) ×3 IMPLANT
GOWN STRL REUS W/TWL LRG LVL3 (GOWN DISPOSABLE) ×3
GRASPER SUT TROCAR 14GX15 (MISCELLANEOUS) ×1 IMPLANT
IRRIGATOR SUCT 8 DISP DVNC XI (IRRIGATION / IRRIGATOR) IMPLANT
IRRIGATOR SUCTION 8MM XI DISP (IRRIGATION / IRRIGATOR)
IV NS 1000ML (IV SOLUTION)
IV NS 1000ML BAXH (IV SOLUTION) IMPLANT
KIT PINK PAD W/HEAD ARE REST (MISCELLANEOUS) ×2
KIT PINK PAD W/HEAD ARM REST (MISCELLANEOUS) ×1 IMPLANT
LABEL OR SOLS (LABEL) ×2 IMPLANT
MESH VENT LT ST 11.4CM CRL (Mesh General) ×1 IMPLANT
NEEDLE HYPO 22GX1.5 SAFETY (NEEDLE) ×2 IMPLANT
NEEDLE VERESS 14GA 120MM (NEEDLE) ×2 IMPLANT
OBTURATOR OPTICAL STANDARD 8MM (TROCAR) ×1
OBTURATOR OPTICAL STND 8 DVNC (TROCAR) ×1
OBTURATOR OPTICALSTD 8 DVNC (TROCAR) ×1 IMPLANT
PACK LAP CHOLECYSTECTOMY (MISCELLANEOUS) ×2 IMPLANT
PENCIL ELECTRO HAND CTR (MISCELLANEOUS) ×2 IMPLANT
SEAL CANN UNIV 5-8 DVNC XI (MISCELLANEOUS) ×2 IMPLANT
SEAL XI 5MM-8MM UNIVERSAL (MISCELLANEOUS) ×4
SOLUTION ELECTROLUBE (MISCELLANEOUS) ×2 IMPLANT
STAPLER CANNULA SEAL DVNC XI (STAPLE) ×1 IMPLANT
STAPLER CANNULA SEAL XI (STAPLE) ×1
SUT DVC VLOC 3-0 CL 6 P-12 (SUTURE) ×2 IMPLANT
SUT MNCRL AB 4-0 PS2 18 (SUTURE) ×2 IMPLANT
SUT STRATAFIX 0 PDS+ CT-2 23 (SUTURE) ×2
SUT VIC AB 3-0 SH 27 (SUTURE)
SUT VIC AB 3-0 SH 27X BRD (SUTURE) ×1 IMPLANT
SUT VICRYL 0 AB UR-6 (SUTURE) ×1 IMPLANT
SUT VLOC 90 2/L VL 12 GS22 (SUTURE) ×1 IMPLANT
SUTURE STRATFX 0 PDS+ CT-2 23 (SUTURE) IMPLANT
SYR 30ML LL (SYRINGE) ×2 IMPLANT
TRAY FOLEY MTR SLVR 16FR STAT (SET/KITS/TRAYS/PACK) ×2 IMPLANT
TROCAR XCEL NON-BLD 5MMX100MML (ENDOMECHANICALS) ×2 IMPLANT
TUBING EVAC SMOKE HEATED PNEUM (TUBING) ×2 IMPLANT

## 2019-07-19 NOTE — Discharge Instructions (Signed)
  Diet: Resume home heart healthy regular diet.   Activity: No heavy lifting >20 pounds (children, pets, laundry, garbage) or strenuous activity until follow-up, but light activity and walking are encouraged. Do not drive or drink alcohol if taking narcotic pain medications.  Wound care: May shower with soapy water and pat dry (do not rub incisions), but no baths or submerging incision underwater until follow-up. (no swimming)   Medications: Resume all home medications. For mild to moderate pain: acetaminophen (Tylenol) or ibuprofen (if no kidney disease). Combining Tylenol with alcohol can substantially increase your risk of causing liver disease. Narcotic pain medications, if prescribed, can be used for severe pain, though may cause nausea, constipation, and drowsiness. Do not combine Tylenol and Norco within a 6 hour period as Norco contains Tylenol. If you do not need the narcotic pain medication, you do not need to fill the prescription.  Call office ([phone]) at any time if any questions, worsening pain, fevers/chills, bleeding, drainage from incision site, or other concerns.   AMBULATORY SURGERY  DISCHARGE INSTRUCTIONS   1) The drugs that you were given will stay in your system until tomorrow so for the next 24 hours you should not:  A) Drive an automobile B) Make any legal decisions C) Drink any alcoholic beverage   2) You may resume regular meals tomorrow.  Today it is better to start with liquids and gradually work up to solid foods.  You may eat anything you prefer, but it is better to start with liquids, then soup and crackers, and gradually work up to solid foods.   3) Please notify your doctor immediately if you have any unusual bleeding, trouble breathing, redness and pain at the surgery site, drainage, fever, or pain not relieved by medication.    4) Additional Instructions:        Please contact your physician with any problems or Same Day Surgery at  [phone], Monday through Friday 6 am to 4 pm, or [place] [facility] Main number at [phone]. 

## 2019-07-19 NOTE — Anesthesia Procedure Notes (Signed)
Procedure Name: Intubation Performed by: Littleton Haub, CRNA Pre-anesthesia Checklist: Patient identified, Patient being monitored, Timeout performed, Emergency Drugs available and Suction available Patient Re-evaluated:Patient Re-evaluated prior to induction Oxygen Delivery Method: Circle system utilized Preoxygenation: Pre-oxygenation with 100% oxygen Induction Type: IV induction Ventilation: Mask ventilation without difficulty Laryngoscope Size: 3 and McGraph Grade View: Grade I Tube type: Oral Tube size: 7.0 mm Number of attempts: 1 Airway Equipment and Method: Stylet and Video-laryngoscopy Placement Confirmation: ETT inserted through vocal cords under direct vision,  positive ETCO2 and breath sounds checked- equal and bilateral Secured at: 22 cm Tube secured with: Tape Dental Injury: Teeth and Oropharynx as per pre-operative assessment        

## 2019-07-19 NOTE — OR Nursing (Signed)
2nd iv right upper arm (epic states right hand) - both iv's d/c'd postop.

## 2019-07-19 NOTE — Anesthesia Preprocedure Evaluation (Signed)
Anesthesia Evaluation  Patient identified by MRN, date of birth, ID band Patient awake    Reviewed: Allergy & Precautions, NPO status , Patient's Chart, lab work & pertinent test results  History of Anesthesia Complications Negative for: history of anesthetic complications  Airway Mallampati: I  TM Distance: >3 FB Neck ROM: Full    Dental no notable dental hx.    Pulmonary neg pulmonary ROS, neg sleep apnea, neg COPD,    breath sounds clear to auscultation- rhonchi (-) wheezing      Cardiovascular Exercise Tolerance: Good (-) hypertension(-) CAD and (-) Past MI  Rhythm:Regular Rate:Normal - Systolic murmurs and - Diastolic murmurs    Neuro/Psych PSYCHIATRIC DISORDERS Anxiety Depression negative neurological ROS     GI/Hepatic negative GI ROS, Neg liver ROS,   Endo/Other  negative endocrine ROSneg diabetes  Renal/GU negative Renal ROS     Musculoskeletal negative musculoskeletal ROS (+)   Abdominal (+) + obese,   Peds  Hematology negative hematology ROS (+)   Anesthesia Other Findings Past Medical History: No date: Anxiety No date: Depression   Reproductive/Obstetrics                             Anesthesia Physical Anesthesia Plan  ASA: II  Anesthesia Plan: General   Post-op Pain Management:    Induction: Intravenous  PONV Risk Score and Plan: 2 and Ondansetron, Dexamethasone and Midazolam  Airway Management Planned: Oral ETT  Additional Equipment:   Intra-op Plan:   Post-operative Plan: Extubation in OR  Informed Consent: I have reviewed the patients History and Physical, chart, labs and discussed the procedure including the risks, benefits and alternatives for the proposed anesthesia with the patient or authorized representative who has indicated his/her understanding and acceptance.     Dental advisory given  Plan Discussed with: CRNA and  Anesthesiologist  Anesthesia Plan Comments:         Anesthesia Quick Evaluation

## 2019-07-19 NOTE — Anesthesia Post-op Follow-up Note (Signed)
Anesthesia QCDR form completed.        

## 2019-07-19 NOTE — Anesthesia Postprocedure Evaluation (Signed)
Anesthesia Post Note  Patient: Brandi Brown  Procedure(s) Performed: XI ROBOTIC ASSISTED VENTRAL HERNIA (N/A Abdomen)  Patient location during evaluation: PACU Anesthesia Type: General Level of consciousness: awake and alert and oriented Pain management: pain level controlled Vital Signs Assessment: post-procedure vital signs reviewed and stable Respiratory status: spontaneous breathing, nonlabored ventilation and respiratory function stable Cardiovascular status: blood pressure returned to baseline and stable Postop Assessment: no signs of nausea or vomiting Anesthetic complications: no     Last Vitals:  Vitals:   07/19/19 1518 07/19/19 1533  BP: 112/70 111/74  Pulse: 78 73  Resp: 14 14  Temp:    SpO2: 100% 95%    Last Pain:  Vitals:   07/19/19 1533  TempSrc:   PainSc: 2                  Leonarda Leis

## 2019-07-19 NOTE — Transfer of Care (Signed)
Immediate Anesthesia Transfer of Care Note  Patient: Brandi Brown  Procedure(s) Performed: XI ROBOTIC ASSISTED VENTRAL HERNIA (N/A Abdomen)  Patient Location: PACU  Anesthesia Type:General  Level of Consciousness: drowsy  Airway & Oxygen Therapy: Patient Spontanous Breathing and Patient connected to face mask oxygen  Post-op Assessment: Report given to RN, Post -op Vital signs reviewed and stable and Patient moving all extremities X 4  Post vital signs: Reviewed and stable  Last Vitals:  Vitals Value Taken Time  BP 111/83 07/19/19 1503  Temp    Pulse 82 07/19/19 1506  Resp 14 07/19/19 1506  SpO2 100 % 07/19/19 1506  Vitals shown include unvalidated device data.  Last Pain:  Vitals:   07/19/19 1230  TempSrc: Tympanic  PainSc: 5          Complications: No apparent anesthesia complications

## 2019-07-19 NOTE — Interval H&P Note (Signed)
Patient with ventral hernia. No changes on physical exam since last visit. Patient again oriented about surgery with robotic assisted laparoscopic ventral hernia repair with mesh. All question answered. Will proceed with planned surgery.

## 2019-07-19 NOTE — Op Note (Signed)
Preoperative diagnosis: Ventral (incisional) hernia.  Postoperative diagnosis: Ventral (incisional) hernia.  Procedure: Robotic assisted Laparoscopic Ventral Hernia Repair  Anesthesia: GETA  Surgeon: Dr. Windell Moment  Wound Classification: Clean  Indications: Patient is a 33 y.o. female developed a ventral hernia in the site of a previous incision. This was symptomatic and repair was indicated.  Findings: 1. A 3 cm x 3 cm incisional hernia 2. A 11 cm round Ventralight mesh used for repair 3. No hollow viscus organ injury identified during procedure 4. Tension free repair achieved 5. Adequate hemostasis  Description of procedure: The patient was brought into the operating room and placed on the table in the supine position. General anesthesia was administered. A time-out was completed verifying correct patient, procedure, site, positioning, and implant(s) and/ or special equipment prior to beginning this procedure. All bony prominences were padded. Both arms were tucked. A Foley was placed. An orogastric tube was placed. The abdomen was prepped from the nipples to proximal thighs and table to table with Chlorhexidine. The patient was draped in the usual sterile fashion.  The fascia was elevated and the Veress needle inserted. Proper position was confirmed by aspiration and saline meniscus test. Carbon dioxide was started on low flow. Once it was flowing easily, it was advanced to high flow.  The abdomen was insufflated with carbon dioxide to a pressure of 15 mmHg. The patient tolerated insufflation well. A 8 mm trocar was inserted in the left upper quadrant. The laparoscope was inserted and the abdomen inspected. Under direct visualization an additional 8-mm trocar was placed in the right upper quadrant. An 8-mm trocar was placed between these two ports. Care was taken to avoid injury to the vessels. The robotic arms were docked.  The omental adhesions were lysed with sharply. The hernia sac  was dissected. With Stratafix 0 the hernia defect was closed.  A Ventralight which measured 11 cm was used for repair of this ventral hernia. The mesh was placed within the peritoneal cavity without any complications. Then, using the proper orientation and the marks placed initially, the mesh was unraveled. The proper orientation of the mesh was maintained throughout the entire procedure, with the nonadherent hydrocellulose-coated side of the mesh facing the bowel. After proper positioning of the mesh,The mesh was fixed circumferentially to the abdominal wall with running 2-0 V lock.  This resulted in the placement of the mesh inside the abdomen covering the hernia defects with an adequate 5 cm overlap along the margins.  The abdominal cavity was inspected and the hernia repair appeared satisfactory. The robot was undocked. The trocars were removed. No bleeding was noted.  The skin was closed with 4-0 Monocryl. Dermabon was applied. The patient tolerated the procedure well and was brought to the PACU in stable condition.   Specimen: hernia sac.   Complications: None  Estimated Blood Loss: 10 mL

## 2019-07-20 ENCOUNTER — Encounter: Payer: Self-pay | Admitting: General Surgery

## 2019-07-21 LAB — SURGICAL PATHOLOGY

## 2019-11-05 ENCOUNTER — Other Ambulatory Visit: Payer: Medicaid Other

## 2019-11-08 ENCOUNTER — Telehealth: Payer: Self-pay

## 2019-11-08 NOTE — Telephone Encounter (Signed)
Patient states that she got tested on the 11/03/2019 and test not showing in system. Spoke with [place] from [employer] and she states that test was negative. Patient was done through [facility]  [phone]

## 2020-02-12 ENCOUNTER — Other Ambulatory Visit: Payer: Self-pay

## 2020-02-12 ENCOUNTER — Ambulatory Visit: Payer: Self-pay | Attending: Internal Medicine

## 2020-02-12 DIAGNOSIS — Z23 Encounter for immunization: Secondary | ICD-10-CM

## 2020-02-12 NOTE — Progress Notes (Signed)
   Covid-19 Vaccination Clinic  Name:  Brandi Brown    MRN: [id number] DOB: 08-17-1986  02/12/2020  [name] was observed post Covid-19 immunization for 15 minutes without incident. She was provided with Vaccine Information Sheet and instruction to access the V-Safe system.   [name] was instructed to call 911 with any severe reactions post vaccine: Marland Kitchen Difficulty breathing  . Swelling of face and throat  . A fast heartbeat  . A bad rash all over body  . Dizziness and weakness   Immunizations Administered    Name Date Dose VIS Date Route   Pfizer COVID-19 Vaccine 02/12/2020  9:01 AM 0.3 mL 12/08/2018 Intramuscular   Manufacturer: [employer], [employer]   Lot: [id number]   NDC: [address]-2

## 2020-03-07 ENCOUNTER — Ambulatory Visit: Payer: Managed Care, Other (non HMO) | Attending: Internal Medicine

## 2020-03-07 DIAGNOSIS — Z23 Encounter for immunization: Secondary | ICD-10-CM

## 2020-03-07 NOTE — Progress Notes (Signed)
° °  Covid-19 Vaccination Clinic  Name:  Brandi Brown    MRN: [id number] DOB: [birth year]  03/07/2020  Brandi Brown was observed post Covid-19 immunization for 15 minutes without incident. She was provided with Vaccine Information Sheet and instruction to access the V-Safe system.   Brandi Brown was instructed to call 911 with any severe reactions post vaccine:  Difficulty breathing   Swelling of face and throat   A fast heartbeat   A bad rash all over body   Dizziness and weakness   Immunizations Administered    Name Date Dose VIS Date Route   Pfizer COVID-19 Vaccine 03/07/2020  3:36 PM 0.3 mL 12/08/2018 Intramuscular   Manufacturer: [employer], [employer]   Lot: [id number]   NDC: [address]-4

## 2020-03-08 ENCOUNTER — Encounter: Payer: Self-pay | Admitting: Oncology

## 2020-03-08 NOTE — Progress Notes (Signed)
Patient prescreened for appt. Pt has bruising from unknown origin to legs and arms,  they have gotten worse over the years. Pt also reports knots to legs.  Pt reports having unintentional weight loss (approx 30 pounds in 7 months). She is starting to gain weight back again.

## 2020-03-09 ENCOUNTER — Inpatient Hospital Stay: Payer: Managed Care, Other (non HMO)

## 2020-03-09 ENCOUNTER — Other Ambulatory Visit: Payer: Self-pay

## 2020-03-09 ENCOUNTER — Inpatient Hospital Stay: Payer: Managed Care, Other (non HMO) | Attending: Oncology | Admitting: Oncology

## 2020-03-09 VITALS — BP 116/74 | HR 81 | Temp 98.8°F | Resp 18 | Ht 62.0 in | Wt 182.5 lb

## 2020-03-09 DIAGNOSIS — R233 Spontaneous ecchymoses: Secondary | ICD-10-CM | POA: Diagnosis not present

## 2020-03-09 DIAGNOSIS — F419 Anxiety disorder, unspecified: Secondary | ICD-10-CM | POA: Insufficient documentation

## 2020-03-09 DIAGNOSIS — R238 Other skin changes: Secondary | ICD-10-CM

## 2020-03-09 DIAGNOSIS — F329 Major depressive disorder, single episode, unspecified: Secondary | ICD-10-CM | POA: Insufficient documentation

## 2020-03-09 DIAGNOSIS — Z8349 Family history of other endocrine, nutritional and metabolic diseases: Secondary | ICD-10-CM | POA: Insufficient documentation

## 2020-03-09 DIAGNOSIS — Z791 Long term (current) use of non-steroidal anti-inflammatories (NSAID): Secondary | ICD-10-CM | POA: Insufficient documentation

## 2020-03-09 DIAGNOSIS — Z79899 Other long term (current) drug therapy: Secondary | ICD-10-CM | POA: Diagnosis not present

## 2020-03-09 LAB — COMPREHENSIVE METABOLIC PANEL
ALT: 78 U/L — ABNORMAL HIGH (ref 0–44)
AST: 82 U/L — ABNORMAL HIGH (ref 15–41)
Albumin: 4 g/dL (ref 3.5–5.0)
Alkaline Phosphatase: 45 U/L (ref 38–126)
Anion gap: 6 (ref 5–15)
BUN: 14 mg/dL (ref 6–20)
CO2: 26 mmol/L (ref 22–32)
Calcium: 8.8 mg/dL — ABNORMAL LOW (ref 8.9–10.3)
Chloride: 104 mmol/L (ref 98–111)
Creatinine, Ser: 0.78 mg/dL (ref 0.44–1.00)
GFR calc Af Amer: >60 mL/min (ref >60)
GFR calc non Af Amer: >60 mL/min (ref >60)
Glucose, Bld: 97 mg/dL (ref 70–99)
Potassium: 4.1 mmol/L (ref 3.5–5.1)
Sodium: 136 mmol/L (ref 135–145)
Total Bilirubin: 0.6 mg/dL (ref 0.3–1.2)
Total Protein: 7.4 g/dL (ref 6.5–8.1)

## 2020-03-09 LAB — PLATELET FUNCTION ASSAY: Collagen / Epinephrine: 85 seconds (ref 0–193)

## 2020-03-09 LAB — PROTIME-INR
INR: 1 (ref 0.8–1.2)
Prothrombin Time: 12.6 seconds (ref 11.4–15.2)

## 2020-03-09 LAB — CBC WITH DIFFERENTIAL/PLATELET
Abs Immature Granulocytes: 0.02 10*3/uL (ref 0.00–0.07)
Basophils Absolute: 0 10*3/uL (ref 0.0–0.1)
Basophils Relative: 1 %
Eosinophils Absolute: 0.1 10*3/uL (ref 0.0–0.5)
Eosinophils Relative: 1 %
HCT: 37.5 % (ref 36.0–46.0)
Hemoglobin: 13.1 g/dL (ref 12.0–15.0)
Immature Granulocytes: 0 %
Lymphocytes Relative: 21 %
Lymphs Abs: 1.2 10*3/uL (ref 0.7–4.0)
MCH: 31.4 pg (ref 26.0–34.0)
MCHC: 34.9 g/dL (ref 30.0–36.0)
MCV: 89.9 fL (ref 80.0–100.0)
Monocytes Absolute: 0.4 10*3/uL (ref 0.1–1.0)
Monocytes Relative: 8 %
Neutro Abs: 3.7 10*3/uL (ref 1.7–7.7)
Neutrophils Relative %: 69 %
Platelets: 206 10*3/uL (ref 150–400)
RBC: 4.17 MIL/uL (ref 3.87–5.11)
RDW: 13.3 % (ref 11.5–15.5)
WBC: 5.4 10*3/uL (ref 4.0–10.5)
nRBC: 0 % (ref 0.0–0.2)

## 2020-03-09 LAB — APTT: aPTT: 29 seconds (ref 24–36)

## 2020-03-09 LAB — TECHNOLOGIST SMEAR REVIEW: Plt Morphology: ADEQUATE

## 2020-03-09 NOTE — Progress Notes (Signed)
Hematology/Oncology Consult note [facility] Telephone:([phone] [phone]   Patient Care Team: [place] Pink, MD as PCP - General (Family Medicine)  REFERRING PROVIDER: Maryland Pink, MD  CHIEF COMPLAINTS/REASON FOR VISIT:  Evaluation of easy bruising.   HISTORY OF PRESENTING ILLNESS:   Brandi Brown is a  34 y.o.  female with PMH listed below was seen in consultation at the request of  Maryland Pink, MD  for evaluation of easy bruising.   She has noticed easy bruising on her upper and lower extremities past few years.   she shows me pictures from her phone with bruising on her upper and lower extremities. Denies hematochezia, hematuria, hematemesis, epistaxis, black tarry stool. Patient has had surgeries in the past including ventral hernia repair in 2020 wisdom tooth extractions in 2016, C-section in 2019 without any bleeding complication.  She has regular menstrual.,  Mild to moderate flow, duration is 4 to 5 days. Denies any unintentional weight loss, night sweating, fever. She has lost weight intentionally. After giving birth to her daughter in 2019, she was started on Prozac for depression. Patient drinks alcohol 1-2 times weekly.  Review of Systems  Constitutional: Negative for appetite change, chills, fatigue and fever.  HENT:   Negative for hearing loss and voice change.   Eyes: Negative for eye problems.  Respiratory: Negative for chest tightness and cough.   Cardiovascular: Negative for chest pain.  Gastrointestinal: Negative for abdominal distention, abdominal pain and blood in stool.  Endocrine: Negative for hot flashes.  Genitourinary: Negative for difficulty urinating and frequency.   Musculoskeletal: Negative for arthralgias.  Skin: Negative for itching and rash.  Neurological: Negative for extremity weakness.  Hematological: Negative for adenopathy. Bruises/bleeds easily.  Psychiatric/Behavioral: Negative for confusion.      MEDICAL HISTORY:  Past Medical History:  Diagnosis Date  . Anxiety   . Depression     SURGICAL HISTORY: Past Surgical History:  Procedure Laterality Date  . CESAREAN SECTION     2007, 2011  . CESAREAN SECTION WITH BILATERAL TUBAL LIGATION Bilateral 05/18/2018   Procedure: REPEAT CESAREAN SECTION WITH BILATERAL TUBAL LIGATION;  Surgeon: Benjaman Kindler, MD;  Location: [facility] ORS;  Service: Obstetrics;  Laterality: Bilateral;  Baby Girl born @ 45 Apgars: 8/9 Weight: 7lbs 5 ozs  . [place] EXTRACTION  2016  . XI ROBOTIC ASSISTED VENTRAL HERNIA N/A 07/19/2019   Procedure: XI ROBOTIC ASSISTED VENTRAL HERNIA;  Surgeon: Herbert Pun, MD;  Location: [facility] ORS;  Service: General;  Laterality: N/A;    SOCIAL HISTORY: Social History   Socioeconomic History  . Marital status: Legally Separated    Spouse name: Not on file  . Number of children: Not on file  . Years of education: Not on file  . Highest education level: Not on file  Occupational History  . Occupation: [employer]   Tobacco Use  . Smoking status: Never Smoker  . Smokeless tobacco: Never Used  Substance and Sexual Activity  . Alcohol use: Yes    Comment: twice weekly  . Drug use: Never  . Sexual activity: Yes  Other Topics Concern  . Not on file  Social History Narrative  . Not on file   Social Determinants of Health   Financial Resource Strain:   . Difficulty of Paying Living Expenses:   Food Insecurity:   . Worried About Charity fundraiser in the Last Year:   . Arboriculturist in the Last Year:   Transportation Needs:   .  Lack of Transportation (Medical):   Marland Kitchen Lack of Transportation (Non-Medical):   Physical Activity:   . Days of Exercise per Week:   . Minutes of Exercise per Session:   Stress:   . Feeling of Stress :   Social Connections:   . Frequency of Communication with Friends and Family:   . Frequency of Social Gatherings with Friends and Family:   . Attends Religious  Services:   . Active Member of Clubs or Organizations:   . Attends Banker Meetings:   Marland Kitchen Marital Status:   Intimate Partner Violence:   . Fear of Current or Ex-Partner:   . Emotionally Abused:   Marland Kitchen Physically Abused:   . Sexually Abused:     FAMILY HISTORY: Family History  Problem Relation Age of Onset  . Thyroid disease Mother   . Kidney disease Father     ALLERGIES:  is allergic to bee venom.  MEDICATIONS:  Current Outpatient Medications  Medication Sig Dispense Refill  . amphetamine-dextroamphetamine (ADDERALL) 30 MG tablet Take 30 mg by mouth 2 (two) times daily.    Marland Kitchen FLUoxetine (PROZAC) 40 MG capsule Take 40 mg by mouth daily.    . Omega-3 Fatty Acids (FISH OIL PO) Take 1 capsule by mouth in the morning and at bedtime.    . traZODone (DESYREL) 50 MG tablet Take 50 mg by mouth at bedtime.    . ferrous sulfate 325 (65 FE) MG tablet Take 1 tablet (325 mg total) by mouth daily with breakfast. (Patient not taking: Reported on 03/08/2020) 30 tablet 3  . ibuprofen (ADVIL,MOTRIN) 600 MG tablet Take 1 tablet (600 mg total) by mouth every 6 (six) hours. (Patient not taking: Reported on 03/08/2020) 65 tablet 1  . Prenatal Vit-Fe Fumarate-FA (PRENATAL MULTIVITAMIN) TABS tablet Take 1 tablet by mouth daily.     . sertraline (ZOLOFT) 50 MG tablet Take 1 tablet (50 mg total) by mouth daily. (Patient not taking: Reported on 03/08/2020) 30 tablet 11   No current facility-administered medications for this visit.     PHYSICAL EXAMINATION: ECOG PERFORMANCE STATUS: 0 - Asymptomatic Vitals:   03/09/20 0945  BP: 116/74  Pulse: 81  Resp: 18  Temp: 98.8 F (37.1 C)   Filed Weights   03/09/20 0945  Weight: 182 lb 8 oz (82.8 kg)    Physical Exam Constitutional:      General: She is not in acute distress. HENT:     Head: Normocephalic and atraumatic.  Eyes:     General: No scleral icterus. Cardiovascular:     Rate and Rhythm: Normal rate and regular rhythm.     Heart  sounds: Normal heart sounds.  Pulmonary:     Effort: Pulmonary effort is normal. No respiratory distress.     Breath sounds: No wheezing.  Abdominal:     General: Bowel sounds are normal. There is no distension.     Palpations: Abdomen is soft.  Musculoskeletal:        General: No deformity. Normal range of motion.     Cervical back: Normal range of motion and neck supple.  Skin:    General: Skin is warm and dry.     Findings: No erythema or rash.     Comments: 1-2 small bruises on her arms and thighs  Neurological:     Mental Status: She is alert and oriented to person, place, and time. Mental status is at baseline.     Cranial Nerves: No cranial nerve deficit.  Coordination: Coordination normal.  Psychiatric:        Mood and Affect: Mood normal.     LABORATORY DATA:  I have reviewed the data as listed Lab Results  Component Value Date   WBC 5.4 03/09/2020   HGB 13.1 03/09/2020   HCT 37.5 03/09/2020   MCV 89.9 03/09/2020   PLT 206 03/09/2020   Recent Labs    03/09/20 1027  NA 136  K 4.1  CL 104  CO2 26  GLUCOSE 97  BUN 14  CREATININE 0.78  CALCIUM 8.8*  GFRNONAA >60  GFRAA >60  PROT 7.4  ALBUMIN 4.0  AST 82*  ALT 78*  ALKPHOS 45  BILITOT 0.6   Iron/TIBC/Ferritin/ %Sat No results found for: IRON, TIBC, FERRITIN, IRONPCTSAT    RADIOGRAPHIC STUDIES: I have personally reviewed the radiological images as listed and agreed with the findings in the report. No results found.    ASSESSMENT & PLAN:  1. Easy bruising    Discussed with patient that the bruises related to more activities tend to appear on distal upper and lower extremities.  Bruises on the trunk, back or face are usually more concerning.  patient has had invasive procedures in the past without any bleeding complication.  I suspect that Prozac may have contributed to her easy bruising. I recommend checking CBC, CMP, PT, PTT, vitamin C level, von Willebrand panel, platelet function assay.   Patient will follow up in 2 weeks for further discussion of lab work out results.  Orders Placed This Encounter  Procedures  . Comprehensive metabolic panel    Standing Status:   Future    Number of Occurrences:   1    Standing Expiration Date:   03/09/2021  . CBC with Differential/Platelet    Standing Status:   Future    Number of Occurrences:   1    Standing Expiration Date:   03/09/2021  . Protime-INR    Standing Status:   Future    Number of Occurrences:   1    Standing Expiration Date:   03/09/2021  . APTT    Standing Status:   Future    Number of Occurrences:   1    Standing Expiration Date:   03/09/2021  . Von Willebrand panel    Standing Status:   Future    Number of Occurrences:   1    Standing Expiration Date:   03/09/2021  . Platelet function assay    Standing Status:   Future    Number of Occurrences:   1    Standing Expiration Date:   03/09/2021  . Vitamin C    Standing Status:   Future    Number of Occurrences:   1    Standing Expiration Date:   03/09/2021  . Technologist smear review    Standing Status:   Future    Number of Occurrences:   1    Standing Expiration Date:   03/09/2021    All questions were answered. The patient knows to call the clinic with any problems questions or concerns.  cc Jerl Mina, MD    Return of visit: 2 weeks Thank you for this kind referral and the opportunity to participate in the care of this patient. A copy of today's note is routed to referring provider    Rickard Patience, MD, PhD Hematology Oncology [facility] Pager- [phone] 03/09/2020

## 2020-03-11 LAB — VON WILLEBRAND PANEL
Coagulation Factor VIII: 170 % — ABNORMAL HIGH (ref 56–140)
Ristocetin Co-factor, Plasma: 181 % (ref 50–200)
Von Willebrand Antigen, Plasma: 205 % — ABNORMAL HIGH (ref 50–200)

## 2020-03-11 LAB — COAG STUDIES INTERP REPORT

## 2020-03-23 LAB — VITAMIN C: Vitamin C: 0.8 mg/dL (ref 0.4–2.0)

## 2020-03-27 ENCOUNTER — Telehealth: Payer: Self-pay

## 2020-03-27 ENCOUNTER — Other Ambulatory Visit: Payer: Self-pay

## 2020-03-27 ENCOUNTER — Inpatient Hospital Stay: Payer: Managed Care, Other (non HMO) | Attending: Oncology | Admitting: Oncology

## 2020-03-27 DIAGNOSIS — Z8349 Family history of other endocrine, nutritional and metabolic diseases: Secondary | ICD-10-CM | POA: Insufficient documentation

## 2020-03-27 DIAGNOSIS — Z79899 Other long term (current) drug therapy: Secondary | ICD-10-CM | POA: Insufficient documentation

## 2020-03-27 DIAGNOSIS — R238 Other skin changes: Secondary | ICD-10-CM | POA: Insufficient documentation

## 2020-03-27 DIAGNOSIS — F419 Anxiety disorder, unspecified: Secondary | ICD-10-CM | POA: Insufficient documentation

## 2020-03-27 DIAGNOSIS — R7401 Elevation of levels of liver transaminase levels: Secondary | ICD-10-CM | POA: Insufficient documentation

## 2020-03-27 DIAGNOSIS — F329 Major depressive disorder, single episode, unspecified: Secondary | ICD-10-CM | POA: Insufficient documentation

## 2020-03-27 NOTE — Telephone Encounter (Signed)
Please reschedule Mychart visit on 6/14. Called pt 4 different times and left voicemail, but still not answer.

## 2020-03-27 NOTE — Telephone Encounter (Signed)
I see she is active on Mychart. MyChart message has been sent.

## 2020-03-27 NOTE — Telephone Encounter (Signed)
Done..   03/27/20 MyChart Visit has been R/S as requested. Called pt x2 (no answer) A detailed message was left on pts vmail making her  Aware of the NEW sched appt date and time.

## 2020-04-04 ENCOUNTER — Inpatient Hospital Stay: Payer: Managed Care, Other (non HMO) | Admitting: Oncology

## 2020-04-07 ENCOUNTER — Inpatient Hospital Stay (HOSPITAL_BASED_OUTPATIENT_CLINIC_OR_DEPARTMENT_OTHER): Payer: Managed Care, Other (non HMO) | Admitting: Oncology

## 2020-04-07 ENCOUNTER — Encounter: Payer: Self-pay | Admitting: Oncology

## 2020-04-07 DIAGNOSIS — R238 Other skin changes: Secondary | ICD-10-CM

## 2020-04-07 DIAGNOSIS — R7401 Elevation of levels of liver transaminase levels: Secondary | ICD-10-CM | POA: Diagnosis not present

## 2020-04-07 DIAGNOSIS — R233 Spontaneous ecchymoses: Secondary | ICD-10-CM

## 2020-04-07 NOTE — Progress Notes (Signed)
Pt contacted for Mychart visit. No new concerns. Voiced.

## 2020-04-07 NOTE — Progress Notes (Signed)
HEMATOLOGY-ONCOLOGY TeleHEALTH VISIT PROGRESS NOTE  I connected with Brandi Brown on 04/07/20 at  2:15 PM EDT by video enabled telemedicine visit and verified that I am speaking with the correct person using two identifiers. I discussed the limitations, risks, security and privacy concerns of performing an evaluation and management service by telemedicine and the availability of in-person appointments. I also discussed with the patient that there may be a patient responsible charge related to this service. The patient expressed understanding and agreed to proceed.   Other persons participating in the visit and their role in the encounter:  None  Patient's location: At work Provider's location: office Chief Complaint: Follow-up for easy bruising   INTERVAL HISTORY Brandi Brown is a 34 y.o. female who has above history reviewed by me today presents for follow up visit for easy bruising Problems and complaints are listed below:  Patient had work-up done during interval.  Presents to discuss results.  She has no new complaints. Review of Systems  Constitutional: Negative for appetite change, chills, fatigue and fever.  HENT:   Negative for hearing loss and voice change.   Eyes: Negative for eye problems.  Respiratory: Negative for chest tightness and cough.   Cardiovascular: Negative for chest pain.  Gastrointestinal: Negative for abdominal distention, abdominal pain and blood in stool.  Endocrine: Negative for hot flashes.  Genitourinary: Negative for difficulty urinating and frequency.   Musculoskeletal: Negative for arthralgias.  Skin: Negative for itching and rash.  Neurological: Negative for extremity weakness.  Hematological: Negative for adenopathy. Bruises/bleeds easily.  Psychiatric/Behavioral: Negative for confusion.    Past Medical History:  Diagnosis Date  . Anxiety   . Depression    Past Surgical History:  Procedure Laterality Date  . CESAREAN SECTION     2007,  2011  . CESAREAN SECTION WITH BILATERAL TUBAL LIGATION Bilateral 05/18/2018   Procedure: REPEAT CESAREAN SECTION WITH BILATERAL TUBAL LIGATION;  Surgeon: Christeen Douglas, MD;  Location: [facility];  Service: Obstetrics;  Laterality: Bilateral;  Baby Girl born @ 6 Apgars: 8/9 Weight: 7lbs 5 ozs  . WISDOM TOOTH EXTRACTION  2016  . XI ROBOTIC ASSISTED VENTRAL HERNIA N/A 07/19/2019   Procedure: XI ROBOTIC ASSISTED VENTRAL HERNIA;  Surgeon: Carolan Shiver, MD;  Location: [facility];  Service: General;  Laterality: N/A;    Family History  Problem Relation Age of Onset  . Thyroid disease Mother   . Kidney disease Father     Social History   Socioeconomic History  . Marital status: Legally Separated    Spouse name: Not on file  . Number of children: Not on file  . Years of education: Not on file  . Highest education level: Not on file  Occupational History  . Occupation: [employer]   Tobacco Use  . Smoking status: Never Smoker  . Smokeless tobacco: Never Used  Vaping Use  . Vaping Use: Every day  Substance and Sexual Activity  . Alcohol use: Yes    Comment: twice weekly  . Drug use: Never  . Sexual activity: Yes  Other Topics Concern  . Not on file  Social History Narrative  . Not on file   Social Determinants of Health   Financial Resource Strain:   . Difficulty of Paying Living Expenses:   Food Insecurity:   . Worried About Programme researcher, broadcasting/film/video in the Last Year:   . Barista in the Last Year:   Transportation Needs:   . Freight forwarder (Medical):   Marland Kitchen  Lack of Transportation (Non-Medical):   Physical Activity:   . Days of Exercise per Week:   . Minutes of Exercise per Session:   Stress:   . Feeling of Stress :   Social Connections:   . Frequency of Communication with Friends and Family:   . Frequency of Social Gatherings with Friends and Family:   . Attends Religious Services:   . Active Member of Clubs or Organizations:   . Attends Theatre manager Meetings:   Marland Kitchen Marital Status:   Intimate Partner Violence:   . Fear of Current or Ex-Partner:   . Emotionally Abused:   Marland Kitchen Physically Abused:   . Sexually Abused:     Current Outpatient Medications on File Prior to Visit  Medication Sig Dispense Refill  . amphetamine-dextroamphetamine (ADDERALL) 30 MG tablet Take 30 mg by mouth 2 (two) times daily.    Marland Kitchen FLUoxetine (PROZAC) 40 MG capsule Take 40 mg by mouth daily.    . Omega-3 Fatty Acids (FISH OIL PO) Take 1 capsule by mouth in the morning and at bedtime.    . traZODone (DESYREL) 50 MG tablet Take 50 mg by mouth at bedtime.     No current facility-administered medications on file prior to visit.    Allergies  Allergen Reactions  . Bee Venom Swelling       Observations/Objective: There were no vitals filed for this visit. There is no height or weight on file to calculate BMI.  Physical Exam Neurological:     Mental Status: She is alert.     CBC    Component Value Date/Time   WBC 5.4 03/09/2020 1027   RBC 4.17 03/09/2020 1027   HGB 13.1 03/09/2020 1027   HCT 37.5 03/09/2020 1027   PLT 206 03/09/2020 1027   MCV 89.9 03/09/2020 1027   MCH 31.4 03/09/2020 1027   MCHC 34.9 03/09/2020 1027   RDW 13.3 03/09/2020 1027   LYMPHSABS 1.2 03/09/2020 1027   MONOABS 0.4 03/09/2020 1027   EOSABS 0.1 03/09/2020 1027   BASOSABS 0.0 03/09/2020 1027    CMP     Component Value Date/Time   NA 136 03/09/2020 1027   K 4.1 03/09/2020 1027   CL 104 03/09/2020 1027   CO2 26 03/09/2020 1027   GLUCOSE 97 03/09/2020 1027   BUN 14 03/09/2020 1027   CREATININE 0.78 03/09/2020 1027   CALCIUM 8.8 (L) 03/09/2020 1027   PROT 7.4 03/09/2020 1027   ALBUMIN 4.0 03/09/2020 1027   AST 82 (H) 03/09/2020 1027   ALT 78 (H) 03/09/2020 1027   ALKPHOS 45 03/09/2020 1027   BILITOT 0.6 03/09/2020 1027   GFRNONAA >60 03/09/2020 1027   GFRAA >60 03/09/2020 1027     Assessment and Plan: 1. Easy bruising   2. Transaminitis     #Easy  bruising, Patient has normal platelet count, normal platelet function, normal vitamin C levels.  Normal PT and PTT, von Willebrand panel test showed no von Willebrand disease.  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  Discussed with her that factor VIII activity increase is associated with more thrombosis risk other than easy bruising and bleeding risk.  In addition factor VIII activity serves as acute phase reactant.  Transaminitis, this is a new problem for her.  I recommend patient repeat liver function testing hepatitis.  If her transaminitis persists, she may need to discuss further with primary care provider for further evaluation.  Follow Up Instructions: No follow-up needed if her repeat blood  works are normal.  Discussed with patient and she agrees with the plan.   I discussed the assessment and treatment plan with the patient. The patient was provided an opportunity to ask questions and all were answered. The patient agreed with the plan and demonstrated an understanding of the instructions.  The patient was advised to call back or seek an in-person evaluation if the symptoms worsen or if the condition fails to improve as anticipated.   Rickard Patience, MD 04/07/2020 7:27 PM

## 2020-04-13 ENCOUNTER — Inpatient Hospital Stay: Payer: Managed Care, Other (non HMO) | Attending: Oncology

## 2021-05-24 IMAGING — CT CT ABD-PELV W/ CM
1 of 2 series · 15 of 32 positions shown, 19 images · IV contrast (APPLIED)
Comparison: None.

CLINICAL DATA: Abdominal wall hernia

EXAM:
CT ABDOMEN AND PELVIS WITH CONTRAST
TECHNIQUE: Multidetector CT imaging of the abdomen and pelvis was performed
using the standard protocol following bolus administration of
intravenous contrast.
CONTRAST:  100mL OMNIPAQUE IOHEXOL 300 MG/ML SOLN, additional oral
enteric con

[Series 2: axial st · axial · 0.72mm/px · z∈[-1033,-618]mm · 15 of 91 slices shown, 19 images]
[im 4/91  soft-tissue]
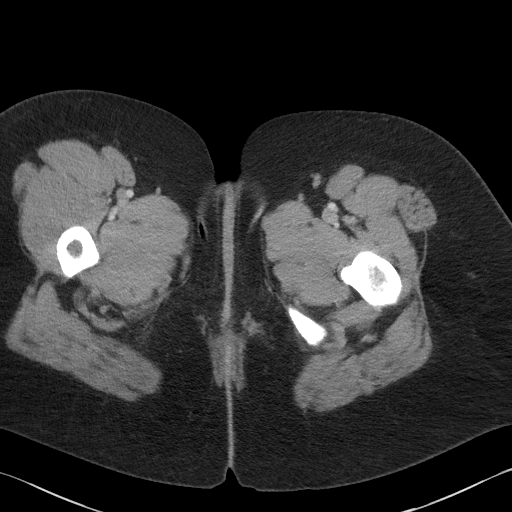
[im 4/91  bone]
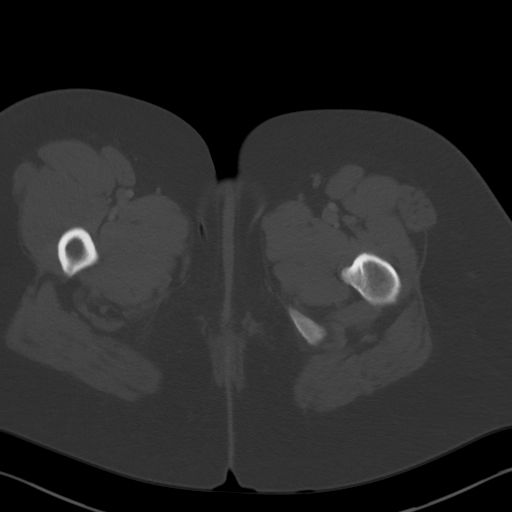
[im 11/91  soft-tissue]
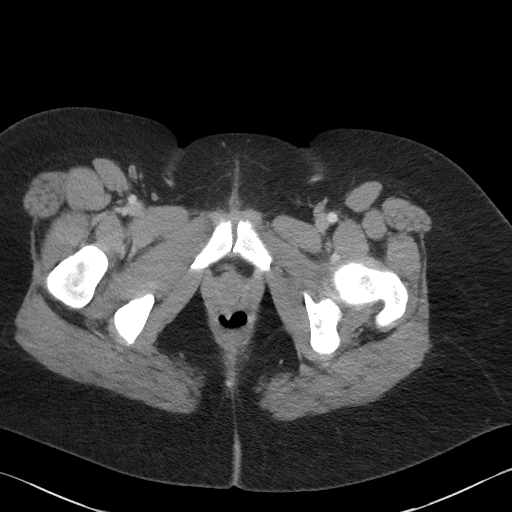
[im 19/91  soft-tissue]
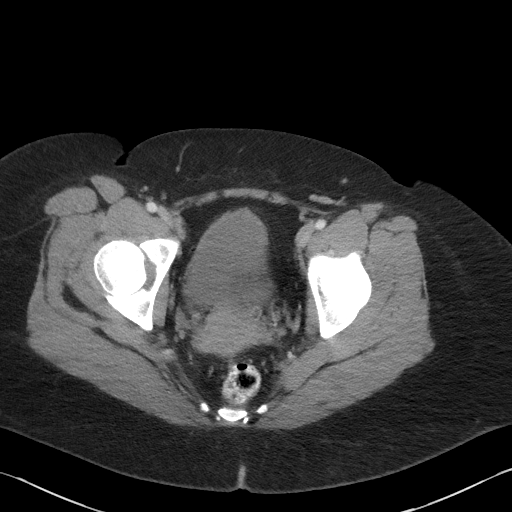
[im 26/91  soft-tissue]
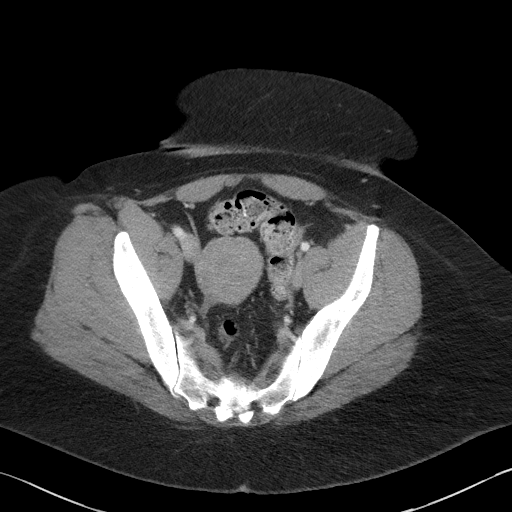
[im 33/91  soft-tissue]
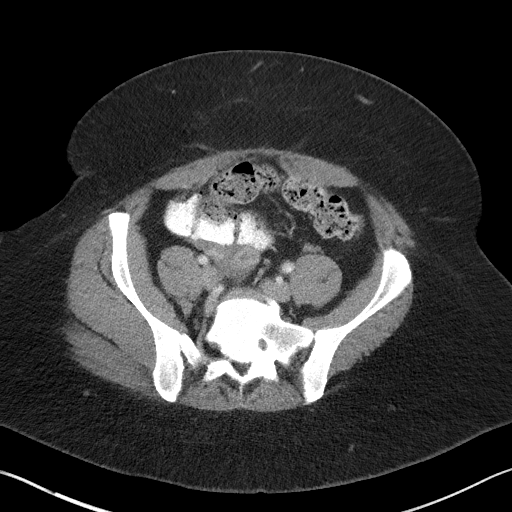
[im 40/91  soft-tissue]
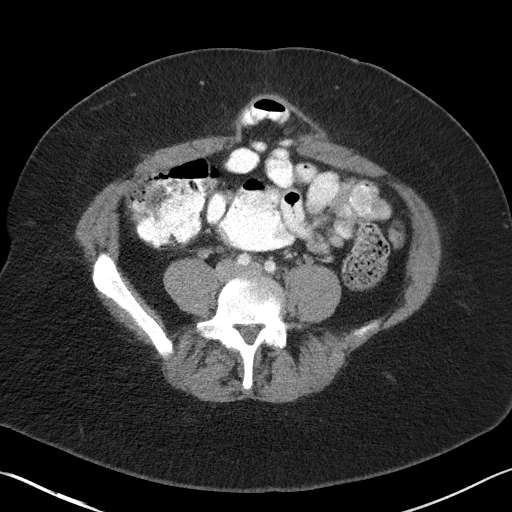
[im 47/91  soft-tissue]
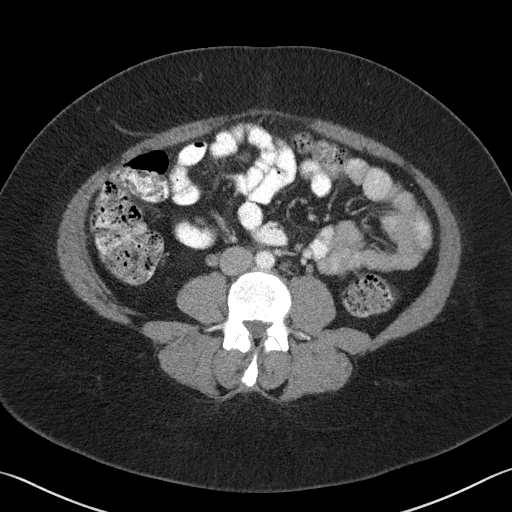
[im 51/91  soft-tissue]
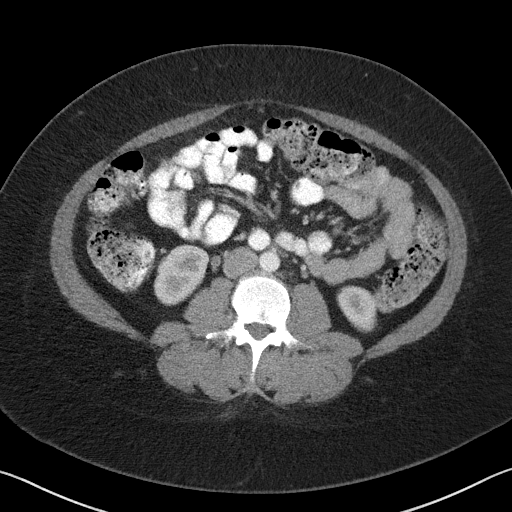
[im 58/91  soft-tissue]
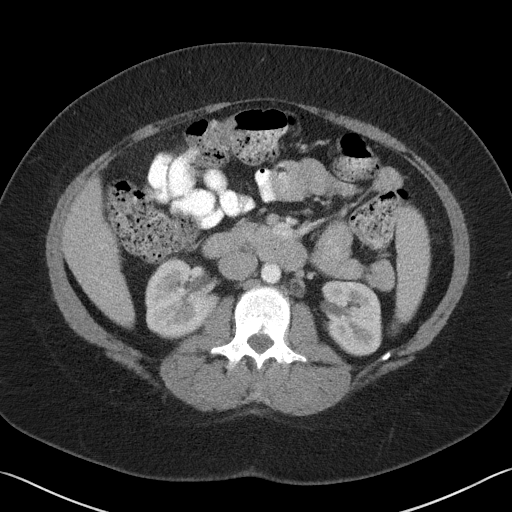
[im 58/91  bone]
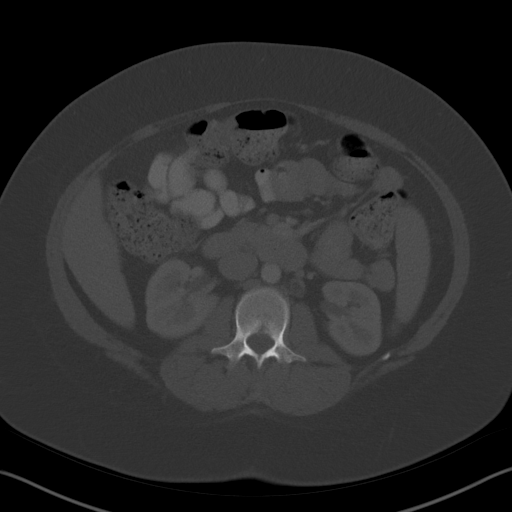
[im 65/91  soft-tissue]
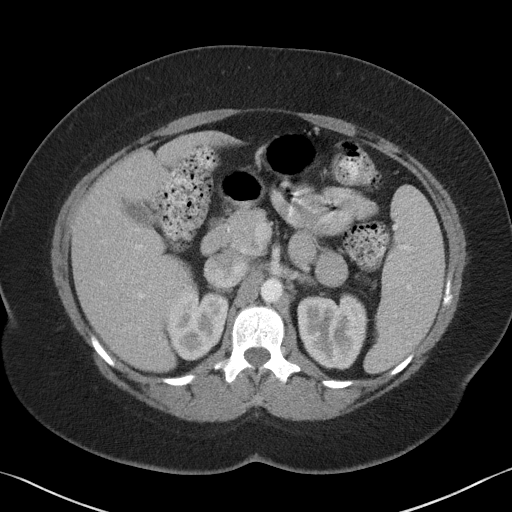
[im 73/91  soft-tissue]
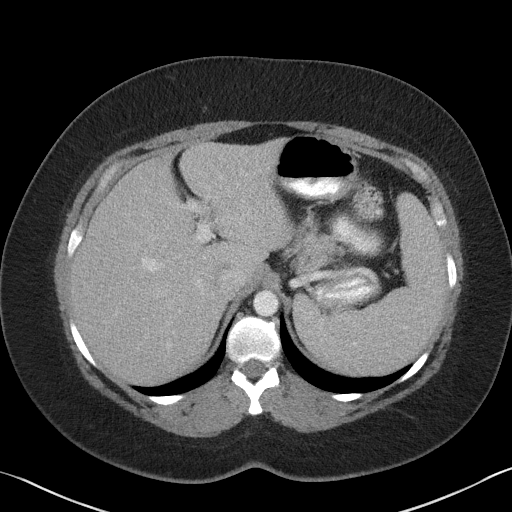
[im 76/91  lung]
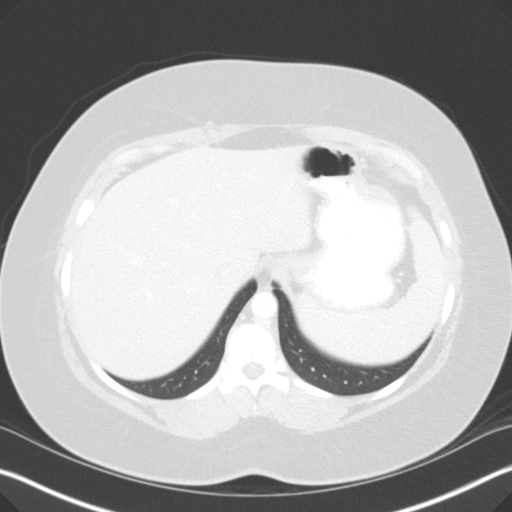
[im 80/91  soft-tissue]
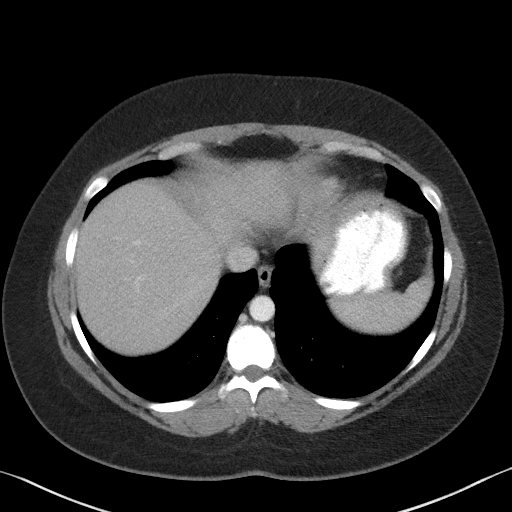
[im 80/91  lung]
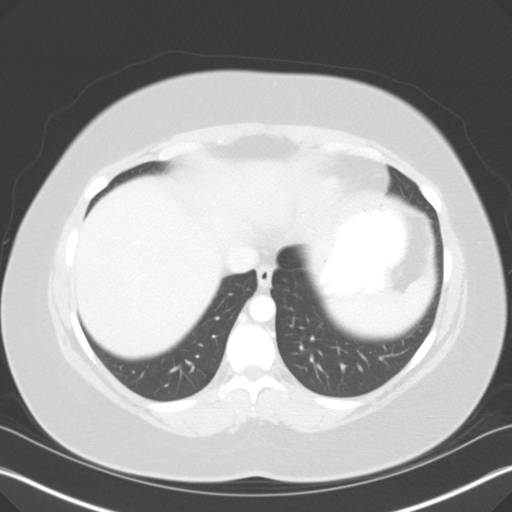
[im 83/91  lung]
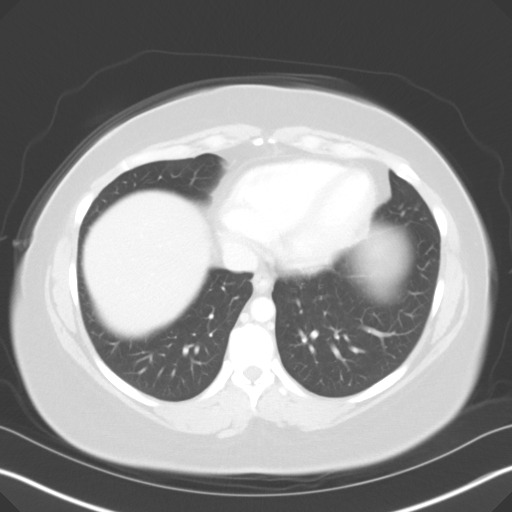
[im 87/91  soft-tissue]
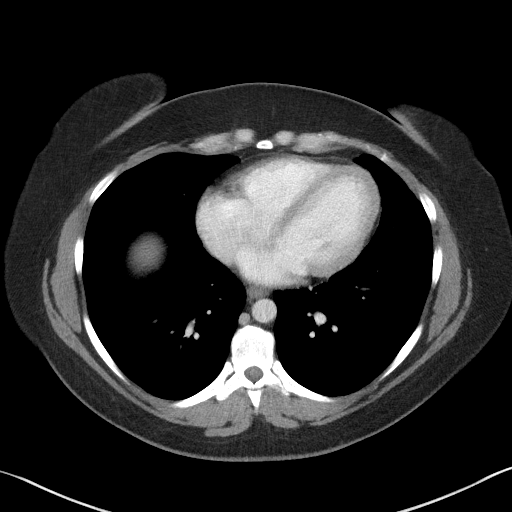
[im 87/91  lung]
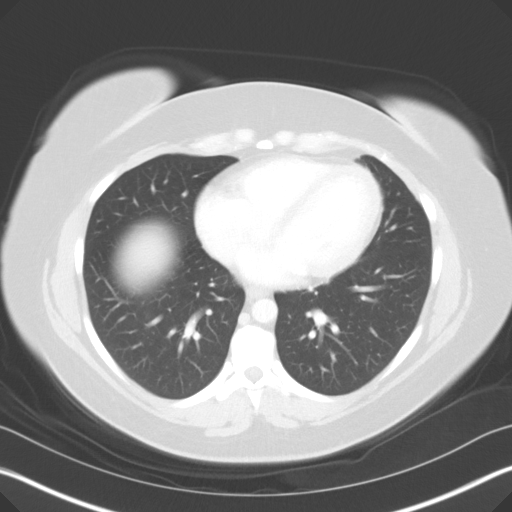

[15 of 32 positions shown; findings below may reference images not displayed]

FINDINGS: Lower chest: No acute abnormality.

Hepatobiliary: No solid liver abnormality is seen. No gallstones,
gallbladder wall thickening, or biliary dilatation.

Pancreas: Unremarkable. No pancreatic ductal dilatation or
surrounding inflammatory changes.

Spleen: Normal in size without significant abnormality.

Adrenals/Urinary Tract: Adrenal glands are unremarkable. Kidneys are
normal, without renal calculi, solid lesion, or hydronephrosis.
Bladder is unremarkable.

Stomach/Bowel: Stomach is within normal limits. Appendix appears
normal. No evidence of bowel wall thickening, distention, or
inflammatory changes. Large burden of stool in the colon and rectum.

Vascular/Lymphatic: No significant vascular findings are present. No
enlarged abdominal or pelvic lymph nodes.

Reproductive: No mass or other significant abnormality.

Other: There is a small ventral hernia of the low midline abdomen
inferior to the umbilicus containing a single loop of nonobstructed
mid small bowel. Hernia neck measures 3.9 cm and hernia sac measures
4.1 cm (series 2, image 53, series 5, image 74). No abdominopelvic
ascites.

Musculoskeletal: No acute or significant osseous findings.
IMPRESSION: There is a small ventral hernia of the low midline abdomen inferior
to the umbilicus containing a single loop of nonobstructed mid small
bowel. Hernia neck measures 3.9 cm and hernia sac measures 4.1 cm
(series 2, image 53, series 5, image 74).

## 2022-04-17 ENCOUNTER — Ambulatory Visit (INDEPENDENT_AMBULATORY_CARE_PROVIDER_SITE_OTHER): Payer: Managed Care, Other (non HMO)

## 2022-04-17 ENCOUNTER — Ambulatory Visit
Admission: RE | Admit: 2022-04-17 | Discharge: 2022-04-17 | Disposition: A | Discharge status: Home / Self Care | Payer: Managed Care, Other (non HMO) | Source: Ambulatory Visit

## 2022-04-17 ENCOUNTER — Other Ambulatory Visit: Payer: Self-pay

## 2022-04-17 VITALS — BP 108/73 | HR 88 | Temp 99.1°F | Resp 16 | Ht 62.0 in | Wt 182.5 lb

## 2022-04-17 DIAGNOSIS — S63501A Unspecified sprain of right wrist, initial encounter: Secondary | ICD-10-CM | POA: Diagnosis not present

## 2022-04-17 DIAGNOSIS — M25531 Pain in right wrist: Secondary | ICD-10-CM
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
## 2022-04-17 NOTE — Discharge Instructions (Addendum)
Wear the wrist splint at all times for the next week to help keep your wrist immobile and decrease inflammation.  Keep your right wrist elevated is much as possible to help with inflammation and the pain as well.  You can apply ice your wrist for 20 minutes at a time 2-3 times a day to help with the pain and inflammation as well.  You may also take over-the-counter ibuprofen according to the package instructions.  Take your splint off once or twice a day to do range of motion exercises as outlined in the discharge instructions.  If your symptoms not improve I recommend following up with orthopedics.

## 2022-04-17 NOTE — ED Triage Notes (Signed)
Pt c/o right wrist pain, weakness, decreased ROM. Started about 2-3 weeks ago.

## 2023-05-29 ENCOUNTER — Other Ambulatory Visit: Payer: Self-pay

## 2023-05-29 ENCOUNTER — Emergency Department
Admission: EM | Admit: 2023-05-29 | Discharge: 2023-05-29 | Disposition: A | Discharge status: Home / Self Care | Payer: Managed Care, Other (non HMO) | Attending: Emergency Medicine | Admitting: Emergency Medicine

## 2023-05-29 ENCOUNTER — Emergency Department: Payer: Managed Care, Other (non HMO)

## 2023-05-29 DIAGNOSIS — K219 Gastro-esophageal reflux disease without esophagitis: Secondary | ICD-10-CM | POA: Diagnosis not present

## 2023-05-29 DIAGNOSIS — R0789 Other chest pain: Secondary | ICD-10-CM

## 2023-05-29 LAB — POC URINE PREG, ED: Preg Test, Ur: NEGATIVE

## 2023-05-29 LAB — COMPREHENSIVE METABOLIC PANEL
ALT: 29 U/L (ref 0–44)
AST: 34 U/L (ref 15–41)
Albumin: 4.1 g/dL (ref 3.5–5.0)
Alkaline Phosphatase: 46 U/L (ref 38–126)
Anion gap: 9 (ref 5–15)
BUN: 12 mg/dL (ref 6–20)
CO2: 26 mmol/L (ref 22–32)
Calcium: 9.2 mg/dL (ref 8.9–10.3)
Chloride: 105 mmol/L (ref 98–111)
Creatinine, Ser: 0.89 mg/dL (ref 0.44–1.00)
GFR, Estimated: >60 mL/min (ref >60)
Glucose, Bld: 93 mg/dL (ref 70–99)
Potassium: 3.4 mmol/L — ABNORMAL LOW (ref 3.5–5.1)
Sodium: 140 mmol/L (ref 135–145)
Total Bilirubin: 0.7 mg/dL (ref 0.3–1.2)
Total Protein: 7.1 g/dL (ref 6.5–8.1)

## 2023-05-29 LAB — TROPONIN I (HIGH SENSITIVITY)
Troponin I (High Sensitivity): 3 ng/L (ref <18)
Troponin I (High Sensitivity): 2 ng/L (ref <18)

## 2023-05-29 LAB — CBC
HCT: 35.6 % — ABNORMAL LOW (ref 36.0–46.0)
Hemoglobin: 11.9 g/dL — ABNORMAL LOW (ref 12.0–15.0)
MCH: 30.3 pg (ref 26.0–34.0)
MCHC: 33.4 g/dL (ref 30.0–36.0)
MCV: 90.6 fL (ref 80.0–100.0)
Platelets: 256 10*3/uL (ref 150–400)
RBC: 3.93 MIL/uL (ref 3.87–5.11)
RDW: 13.8 % (ref 11.5–15.5)
WBC: 7.8 10*3/uL (ref 4.0–10.5)
nRBC: 0 % (ref 0.0–0.2)

## 2023-05-29 MED ORDER — ALUM & MAG HYDROXIDE-SIMETH 200-200-20 MG/5ML PO SUSP
30.0000 mL | Freq: Once | ORAL | Status: AC
  Administered 2023-05-29: 30 mL via ORAL
  Filled 2023-05-29: qty 30

## 2023-05-29 MED ORDER — METOCLOPRAMIDE HCL 10 MG PO TABS
10.0000 mg | ORAL_TABLET | Freq: Once | ORAL | Status: AC
  Administered 2023-05-29: 10 mg via ORAL
  Filled 2023-05-29: qty 1

## 2023-05-29 MED ORDER — SUCRALFATE 1 G PO TABS
1.0000 g | ORAL_TABLET | Freq: Once | ORAL | Status: AC
  Administered 2023-05-29: 1 g via ORAL
  Filled 2023-05-29: qty 1

## 2023-05-29 MED ORDER — ALUMINUM-MAGNESIUM-SIMETHICONE 200-200-20 MG/5ML PO SUSP: 30.0000 mL | Freq: Three times a day (TID) | ORAL | 0 refills | Status: AC

## 2023-05-29 MED ORDER — FAMOTIDINE 20 MG PO TABS
40.0000 mg | ORAL_TABLET | Freq: Once | ORAL | Status: AC
  Administered 2023-05-29: 40 mg via ORAL
  Filled 2023-05-29: qty 2

## 2023-05-29 MED ORDER — FAMOTIDINE 20 MG PO TABS: 20.0000 mg | ORAL_TABLET | Freq: Two times a day (BID) | ORAL | 0 refills | Status: AC

## 2023-05-29 NOTE — ED Provider Notes (Signed)
[facility] Provider Note    Event Date/Time   First MD Initiated Contact with Patient 05/29/23 [phone]     (approximate)   History   Chief Complaint: Chest Pain   HPI  Brandi Brown is a 37 y.o. female with no significant past medical history who comes ED complaining of epigastric pain that started at about midnight tonight.  Worse laying down, better sitting up.  No shortness of breath diaphoresis or vomiting, nonradiating.  No exertional symptoms, not pleuritic.  No fever.  No vomiting.  Feels like heartburn.     Physical Exam   Triage Vital Signs: ED Triage Vitals  Encounter Vitals Group     BP 05/29/23 0049 121/68     Systolic BP Percentile --      Diastolic BP Percentile --      Pulse Rate 05/29/23 0049 78     Resp 05/29/23 0320 16     Temp 05/29/23 0049 98 F (36.7 C)     Temp Source 05/29/23 0049 Oral     SpO2 05/29/23 0049 97 %     Weight 05/29/23 0047 230 lb (104.3 kg)     Height 05/29/23 0047 5\' 2"  (1.575 m)     Head Circumference --      Peak Flow --      Pain Score 05/29/23 0047 7     Pain Loc --      Pain Education --      Exclude from Growth Chart --     Most recent vital signs: Vitals:   05/29/23 0320 05/29/23 0510  BP:  118/74  Pulse:  70  Resp: 16 16  Temp:    SpO2:      General: Awake, no distress.  CV:  Good peripheral perfusion.  Regular rate rhythm Resp:  Normal effort.  Clear to auscultation bilaterally Abd:  No distention.  Mild left upper quadrant tenderness Other:  No lower extremity edema.  Moist oral mucosa   ED Results / Procedures / Treatments   Labs (all labs ordered are listed, but only abnormal results are displayed) Labs Reviewed  CBC - Abnormal; Notable for the following components:      Result Value   Hemoglobin 11.9 (*)    HCT 35.6 (*)    All other components within normal limits  COMPREHENSIVE METABOLIC PANEL - Abnormal; Notable for the following components:   Potassium 3.4 (*)    All  other components within normal limits  POC URINE PREG, ED  TROPONIN I (HIGH SENSITIVITY)  TROPONIN I (HIGH SENSITIVITY)     EKG Interpreted by me Normal sinus rhythm rate of 82.  Normal axis intervals QRS ST segments and T waves   RADIOLOGY Chest x-ray interpreted by me, appears normal.  Radiology report reviewed   PROCEDURES:  Procedures   MEDICATIONS ORDERED IN ED: Medications  alum & mag hydroxide-simeth (MAALOX/MYLANTA) 200-200-20 MG/5ML suspension 30 mL (30 mLs Oral Given 05/29/23 0053)  sucralfate (CARAFATE) tablet 1 g (1 g Oral Given 05/29/23 0421)  famotidine (PEPCID) tablet 40 mg (40 mg Oral Given 05/29/23 0422)  metoCLOPramide (REGLAN) tablet 10 mg (10 mg Oral Given 05/29/23 0422)     IMPRESSION / MDM / ASSESSMENT AND PLAN / ED COURSE  I reviewed the triage vital signs and the nursing notes.  DDx: GERD, NSTEMI, pneumothorax, arrhythmia, electrolyte abnormality  Patient's presentation is most consistent with acute presentation with potential threat to life or bodily function.  Patient presents with  epigastric pain, atypical for cardiopulmonary disease.  Most consistent with GERD/gastritis.  Doubt pancreatitis or biliary disease, abdomen is otherwise benign.  Labs are reassuring.  Will trial antacids   Clinical Course as of 05/29/23 0637  Thu May 29, 2023  [id number] Feels better, sx resolved. Workup normal. Stable for DC.  [PS]    Clinical Course User Index [PS] Sharman Cheek, MD     FINAL CLINICAL IMPRESSION(S) / ED DIAGNOSES   Final diagnoses:  Atypical chest pain  Gastroesophageal reflux disease, unspecified whether esophagitis present     Rx / DC Orders   ED Discharge Orders          Ordered    famotidine (PEPCID) 20 MG tablet  2 times daily        05/29/23 0442    aluminum-magnesium hydroxide-simethicone (MAALOX) 200-200-20 MG/5ML SUSP  3 times daily before meals & bedtime        05/29/23 0442             Note:  This document was  prepared using Dragon voice recognition software and may include unintentional dictation errors.   Sharman Cheek, MD 05/29/23 [phone]

## 2023-05-29 NOTE — ED Triage Notes (Signed)
Epigastric chest pain that onset 30 min pta. Reports pain is a burning and feels like heartburn. Denies hx of GERD. Reports similar episode last week. Denies n/v, h/a, dizziness, vision change. Pt alert and oriented. Ambulatory to triage. Denies SOB. Breathing unlabored speaking in full sentences with symmetric chest rise and fall.

## 2024-04-19 ENCOUNTER — Other Ambulatory Visit: Payer: Self-pay | Admitting: Family Medicine

## 2024-04-19 DIAGNOSIS — R110 Nausea: Secondary | ICD-10-CM

## 2024-04-19 DIAGNOSIS — R1013 Epigastric pain: Secondary | ICD-10-CM

## 2024-04-21 ENCOUNTER — Ambulatory Visit
Admission: RE | Admit: 2024-04-21 | Discharge: 2024-04-21 | Disposition: A | Discharge status: Home / Self Care | Source: Ambulatory Visit | Attending: Family Medicine | Admitting: Family Medicine

## 2024-04-21 DIAGNOSIS — R1013 Epigastric pain: Secondary | ICD-10-CM | POA: Diagnosis present

## 2024-04-21 DIAGNOSIS — R110 Nausea: Secondary | ICD-10-CM | POA: Diagnosis present
